# Patient Record
Sex: Female | Born: 1945
Health system: Southern US, Community
[De-identification: ages and names within clinical notes are randomized; demographics above are authoritative.]

## PROBLEM LIST (undated history)

## (undated) DIAGNOSIS — R51 Headache: Secondary | ICD-10-CM

## (undated) DIAGNOSIS — IMO0001 Reserved for inherently not codable concepts without codable children: Secondary | ICD-10-CM

## (undated) DIAGNOSIS — M1990 Unspecified osteoarthritis, unspecified site: Secondary | ICD-10-CM

## (undated) DIAGNOSIS — M1711 Unilateral primary osteoarthritis, right knee: Secondary | ICD-10-CM

## (undated) DIAGNOSIS — K219 Gastro-esophageal reflux disease without esophagitis: Secondary | ICD-10-CM

## (undated) DIAGNOSIS — E669 Obesity, unspecified: Secondary | ICD-10-CM

## (undated) DIAGNOSIS — R519 Headache, unspecified: Secondary | ICD-10-CM

## (undated) DIAGNOSIS — I10 Essential (primary) hypertension: Secondary | ICD-10-CM

## (undated) HISTORY — PX: OTHER SURGICAL HISTORY: SHX169

## (undated) HISTORY — PX: TUBAL LIGATION: SHX77

## (undated) HISTORY — PX: HERNIA REPAIR: SHX51

## (undated) HISTORY — PX: KNEE ARTHROSCOPY: SHX127

## (undated) HISTORY — DX: Obesity, unspecified: E66.9

## (undated) HISTORY — PX: CHOLECYSTECTOMY: SHX55

## (undated) HISTORY — PX: APPENDECTOMY: SHX54

---

## 1998-12-30 ENCOUNTER — Other Ambulatory Visit: Admission: RE | Admit: 1998-12-30 | Discharge: 1998-12-30 | Payer: Self-pay | Admitting: Obstetrics & Gynecology

## 2014-03-06 DIAGNOSIS — Z1211 Encounter for screening for malignant neoplasm of colon: Secondary | ICD-10-CM | POA: Diagnosis not present

## 2014-03-06 DIAGNOSIS — I10 Essential (primary) hypertension: Secondary | ICD-10-CM | POA: Diagnosis not present

## 2014-03-06 DIAGNOSIS — E780 Pure hypercholesterolemia: Secondary | ICD-10-CM | POA: Diagnosis not present

## 2014-03-08 DIAGNOSIS — Z1231 Encounter for screening mammogram for malignant neoplasm of breast: Secondary | ICD-10-CM | POA: Diagnosis not present

## 2014-04-11 DIAGNOSIS — M2391 Unspecified internal derangement of right knee: Secondary | ICD-10-CM | POA: Diagnosis not present

## 2014-04-22 DIAGNOSIS — Z1212 Encounter for screening for malignant neoplasm of rectum: Secondary | ICD-10-CM | POA: Diagnosis not present

## 2014-04-22 DIAGNOSIS — Z1211 Encounter for screening for malignant neoplasm of colon: Secondary | ICD-10-CM | POA: Diagnosis not present

## 2014-10-22 DIAGNOSIS — M1711 Unilateral primary osteoarthritis, right knee: Secondary | ICD-10-CM | POA: Diagnosis not present

## 2014-10-24 DIAGNOSIS — Z1389 Encounter for screening for other disorder: Secondary | ICD-10-CM | POA: Diagnosis not present

## 2014-10-24 DIAGNOSIS — Z23 Encounter for immunization: Secondary | ICD-10-CM | POA: Diagnosis not present

## 2014-10-24 DIAGNOSIS — Z01810 Encounter for preprocedural cardiovascular examination: Secondary | ICD-10-CM | POA: Diagnosis not present

## 2014-10-24 DIAGNOSIS — Z01818 Encounter for other preprocedural examination: Secondary | ICD-10-CM | POA: Diagnosis not present

## 2014-11-03 NOTE — Pre-Procedure Instructions (Signed)
Samantha Schwartz  11/03/2014     No Pharmacies Listed   Your procedure is scheduled on Tues, Oct 11 @ 11:30 AM  Report to [facility] Admitting at 9:30 AM  Call this number if you have problems the morning of surgery:  [phone]   Remember:  Do not eat food or drink liquids after midnight.  Take these medicines the morning of surgery with A SIP OF WATER: Norvasc            NO Goody's,BC's,Aleve,Aspirin,Ibuprofen,Fish Oil,or any Herbal Medications.    Do not wear jewelry, make-up or nail polish.  Do not wear lotions, powders, or perfumes.  You may wear deodorant.  Do not shave 48 hours prior to surgery.  Men may shave face and neck.  Do not bring valuables to the hospital.  [facility] is not responsible for any belongings or valuables.  Contacts, dentures or bridgework may not be worn into surgery.  Leave your suitcase in the car.  After surgery it may be brought to your room.  For patients admitted to the hospital, discharge time will be determined by your treatment team.  Patients discharged the day of surgery will not be allowed to drive home.    Special instructions:  [facility] - Preparing for Surgery  Before surgery, you can play an important role.  Because skin is not sterile, your skin needs to be as free of germs as possible.  You can reduce the number of germs on you skin by washing with CHG (chlorahexidine gluconate) soap before surgery.  CHG is an antiseptic cleaner which kills germs and bonds with the skin to continue killing germs even after washing.  Please DO NOT use if you have an allergy to CHG or antibacterial soaps.  If your skin becomes reddened/irritated stop using the CHG and inform your nurse when you arrive at Short Stay.  Do not shave (including legs and underarms) for at least 48 hours prior to the first CHG shower.  You may shave your face.  Please follow these instructions carefully:   1.  Shower with CHG Soap the night before  surgery and the                                morning of Surgery.  2.  If you choose to wash your hair, wash your hair first as usual with your       normal shampoo.  3.  After you shampoo, rinse your hair and body thoroughly to remove the                      Shampoo.  4.  Use CHG as you would any other liquid soap.  You can apply chg directly       to the skin and wash gently with scrungie or a clean washcloth.  5.  Apply the CHG Soap to your body ONLY FROM THE NECK DOWN.        Do not use on open wounds or open sores.  Avoid contact with your eyes,       ears, mouth and genitals (private parts).  Wash genitals (private parts)       with your normal soap.  6.  Wash thoroughly, paying special attention to the area where your surgery        will be performed.  7.  Thoroughly rinse your  body with warm water from the neck down.  8.  DO NOT shower/wash with your normal soap after using and rinsing off       the CHG Soap.  9.  Pat yourself dry with a clean towel.            10.  Wear clean pajamas.            11.  Place clean sheets on your bed the night of your first shower and do not        sleep with pets.  Day of Surgery  Do not apply any lotions/deoderants the morning of surgery.  Please wear clean clothes to the hospital/surgery center.    Please read over the following fact sheets that you were given. Pain Booklet, Coughing and Deep Breathing, MRSA Information and Surgical Site Infection Prevention

## 2014-11-05 ENCOUNTER — Encounter (HOSPITAL_COMMUNITY): Payer: Self-pay

## 2014-11-05 ENCOUNTER — Encounter (HOSPITAL_COMMUNITY)
Admission: RE | Admit: 2014-11-05 | Discharge: 2014-11-05 | Disposition: A | Discharge status: Home / Self Care | Payer: Commercial Managed Care - HMO | Source: Ambulatory Visit | Attending: Orthopedic Surgery | Admitting: Orthopedic Surgery

## 2014-11-05 DIAGNOSIS — Z01818 Encounter for other preprocedural examination: Secondary | ICD-10-CM | POA: Diagnosis not present

## 2014-11-05 DIAGNOSIS — M179 Osteoarthritis of knee, unspecified: Secondary | ICD-10-CM | POA: Diagnosis not present

## 2014-11-05 HISTORY — DX: Headache: R51

## 2014-11-05 HISTORY — DX: Gastro-esophageal reflux disease without esophagitis: K21.9

## 2014-11-05 HISTORY — DX: Essential (primary) hypertension: I10

## 2014-11-05 HISTORY — DX: Unspecified osteoarthritis, unspecified site: M19.90

## 2014-11-05 HISTORY — DX: Headache, unspecified: R51.9

## 2014-11-05 HISTORY — DX: Reserved for inherently not codable concepts without codable children: IMO0001

## 2014-11-05 LAB — CBC
HEMATOCRIT: 37.8 % (ref 36.0–46.0)
Hemoglobin: 12.4 g/dL (ref 12.0–15.0)
MCH: 28.8 pg (ref 26.0–34.0)
MCHC: 32.8 g/dL (ref 30.0–36.0)
MCV: 87.9 fL (ref 78.0–100.0)
PLATELETS: 372 10*3/uL (ref 150–400)
RBC: 4.3 MIL/uL (ref 3.87–5.11)
RDW: 12.3 % (ref 11.5–15.5)
WBC: 6.9 10*3/uL (ref 4.0–10.5)

## 2014-11-05 LAB — BASIC METABOLIC PANEL
Anion gap: 9 (ref 5–15)
BUN: 12 mg/dL (ref 6–20)
CHLORIDE: 101 mmol/L (ref 101–111)
CO2: 25 mmol/L (ref 22–32)
CREATININE: 0.88 mg/dL (ref 0.44–1.00)
Calcium: 9.5 mg/dL (ref 8.9–10.3)
GFR calc Af Amer: >60 mL/min (ref >60)
GFR calc non Af Amer: >60 mL/min (ref >60)
GLUCOSE: 107 mg/dL — AB (ref 65–99)
POTASSIUM: 4 mmol/L (ref 3.5–5.1)
Sodium: 135 mmol/L (ref 135–145)

## 2014-11-05 LAB — SURGICAL PCR SCREEN
MRSA, PCR: NEGATIVE
STAPHYLOCOCCUS AUREUS: NEGATIVE

## 2014-11-05 NOTE — Progress Notes (Signed)
PCP is Dr Bertram Millard at [facility] in [place] Denies ever seeing a cardiologist Denies ever having a stress test or echo States that she had a card cath many yeas ago.

## 2014-11-13 ENCOUNTER — Inpatient Hospital Stay (HOSPITAL_COMMUNITY): Payer: Commercial Managed Care - HMO | Admitting: Anesthesiology

## 2014-11-13 ENCOUNTER — Inpatient Hospital Stay (HOSPITAL_COMMUNITY)
Admission: RE | Admit: 2014-11-13 | Discharge: 2014-11-15 | DRG: 470 | Disposition: A | Discharge status: Home Health | Payer: Commercial Managed Care - HMO | Source: Ambulatory Visit | Attending: Orthopedic Surgery | Admitting: Orthopedic Surgery

## 2014-11-13 ENCOUNTER — Encounter (HOSPITAL_COMMUNITY)
Admission: RE | Disposition: A | Discharge status: Home Health | Payer: Self-pay | Source: Ambulatory Visit | Attending: Orthopedic Surgery

## 2014-11-13 ENCOUNTER — Inpatient Hospital Stay (HOSPITAL_COMMUNITY): Payer: Commercial Managed Care - HMO

## 2014-11-13 ENCOUNTER — Encounter (HOSPITAL_COMMUNITY): Payer: Self-pay | Admitting: *Deleted

## 2014-11-13 DIAGNOSIS — M179 Osteoarthritis of knee, unspecified: Secondary | ICD-10-CM | POA: Diagnosis present

## 2014-11-13 DIAGNOSIS — D62 Acute posthemorrhagic anemia: Secondary | ICD-10-CM | POA: Diagnosis not present

## 2014-11-13 DIAGNOSIS — M1711 Unilateral primary osteoarthritis, right knee: Secondary | ICD-10-CM | POA: Diagnosis not present

## 2014-11-13 DIAGNOSIS — Z471 Aftercare following joint replacement surgery: Secondary | ICD-10-CM | POA: Diagnosis not present

## 2014-11-13 DIAGNOSIS — Z96651 Presence of right artificial knee joint: Secondary | ICD-10-CM | POA: Diagnosis not present

## 2014-11-13 DIAGNOSIS — Z7982 Long term (current) use of aspirin: Secondary | ICD-10-CM

## 2014-11-13 DIAGNOSIS — I10 Essential (primary) hypertension: Secondary | ICD-10-CM | POA: Diagnosis not present

## 2014-11-13 DIAGNOSIS — Z888 Allergy status to other drugs, medicaments and biological substances status: Secondary | ICD-10-CM | POA: Diagnosis not present

## 2014-11-13 DIAGNOSIS — Z96659 Presence of unspecified artificial knee joint: Secondary | ICD-10-CM

## 2014-11-13 DIAGNOSIS — K219 Gastro-esophageal reflux disease without esophagitis: Secondary | ICD-10-CM | POA: Diagnosis not present

## 2014-11-13 DIAGNOSIS — Z79899 Other long term (current) drug therapy: Secondary | ICD-10-CM | POA: Diagnosis not present

## 2014-11-13 DIAGNOSIS — Z889 Allergy status to unspecified drugs, medicaments and biological substances status: Secondary | ICD-10-CM | POA: Diagnosis not present

## 2014-11-13 DIAGNOSIS — M1710 Unilateral primary osteoarthritis, unspecified knee: Secondary | ICD-10-CM | POA: Diagnosis present

## 2014-11-13 DIAGNOSIS — R0602 Shortness of breath: Secondary | ICD-10-CM | POA: Diagnosis not present

## 2014-11-13 DIAGNOSIS — M25561 Pain in right knee: Secondary | ICD-10-CM | POA: Diagnosis present

## 2014-11-13 HISTORY — DX: Unilateral primary osteoarthritis, right knee: M17.11

## 2014-11-13 HISTORY — PX: TOTAL KNEE ARTHROPLASTY: SHX125

## 2014-11-13 SURGERY — ARTHROPLASTY, KNEE, TOTAL
Anesthesia: Spinal | Site: Knee | Laterality: Right

## 2014-11-13 MED ORDER — SODIUM CHLORIDE 0.9 % IR SOLN
Status: DC | PRN
  Administered 2014-11-13: 3000 mL

## 2014-11-13 MED ORDER — RIVAROXABAN 10 MG PO TABS
10.0000 mg | ORAL_TABLET | Freq: Every day | ORAL | Status: DC
  Administered 2014-11-14 – 2014-11-15 (×2): 10 mg via ORAL
  Filled 2014-11-13 (×2): qty 1

## 2014-11-13 MED ORDER — HYDROMORPHONE HCL 1 MG/ML IJ SOLN
INTRAMUSCULAR | Status: AC
Start: 2014-11-13 — End: 2014-11-14
  Filled 2014-11-13: qty 1

## 2014-11-13 MED ORDER — AMLODIPINE BESYLATE 5 MG PO TABS
5.0000 mg | ORAL_TABLET | Freq: Every day | ORAL | Status: DC
  Administered 2014-11-14 – 2014-11-15 (×2): 5 mg via ORAL
  Filled 2014-11-13 (×2): qty 1

## 2014-11-13 MED ORDER — ACETAMINOPHEN 650 MG RE SUPP: 650.0000 mg | Freq: Four times a day (QID) | RECTAL | Status: DC | PRN

## 2014-11-13 MED ORDER — METOCLOPRAMIDE HCL 5 MG/ML IJ SOLN: 5.0000 mg | Freq: Three times a day (TID) | INTRAMUSCULAR | Status: DC | PRN

## 2014-11-13 MED ORDER — METHOCARBAMOL 1000 MG/10ML IJ SOLN
500.0000 mg | Freq: Four times a day (QID) | INTRAVENOUS | Status: DC | PRN
  Filled 2014-11-13: qty 5

## 2014-11-13 MED ORDER — DEXTROSE 5 % IV SOLN
10.0000 mg | INTRAVENOUS | Status: DC | PRN
  Administered 2014-11-13: 40 ug/min via INTRAVENOUS

## 2014-11-13 MED ORDER — ROCURONIUM BROMIDE 50 MG/5ML IV SOLN
INTRAVENOUS | Status: AC
Start: 2014-11-13 — End: 2014-11-13
  Filled 2014-11-13: qty 1

## 2014-11-13 MED ORDER — BACLOFEN 10 MG PO TABS: 10.0000 mg | ORAL_TABLET | Freq: Three times a day (TID) | ORAL | Status: DC

## 2014-11-13 MED ORDER — OXYCODONE-ACETAMINOPHEN 5-325 MG PO TABS: 1.0000 | ORAL_TABLET | Freq: Four times a day (QID) | ORAL | Status: DC | PRN

## 2014-11-13 MED ORDER — ACETAMINOPHEN 325 MG PO TABS: 650.0000 mg | ORAL_TABLET | Freq: Four times a day (QID) | ORAL | Status: DC | PRN

## 2014-11-13 MED ORDER — PHENYLEPHRINE 40 MCG/ML (10ML) SYRINGE FOR IV PUSH (FOR BLOOD PRESSURE SUPPORT)
PREFILLED_SYRINGE | INTRAVENOUS | Status: AC
  Filled 2014-11-13: qty 20

## 2014-11-13 MED ORDER — CEFAZOLIN SODIUM-DEXTROSE 2-3 GM-% IV SOLR
INTRAVENOUS | Status: AC
  Filled 2014-11-13: qty 50

## 2014-11-13 MED ORDER — MENTHOL 3 MG MT LOZG: 1.0000 | LOZENGE | OROMUCOSAL | Status: DC | PRN

## 2014-11-13 MED ORDER — DULOXETINE HCL 30 MG PO CPEP
30.0000 mg | ORAL_CAPSULE | Freq: Every day | ORAL | Status: DC
  Administered 2014-11-13 – 2014-11-15 (×3): 30 mg via ORAL
  Filled 2014-11-13 (×3): qty 1

## 2014-11-13 MED ORDER — POTASSIUM 99 MG PO TABS: 2.0000 | ORAL_TABLET | Freq: Every day | ORAL | Status: DC

## 2014-11-13 MED ORDER — BISACODYL 10 MG RE SUPP: 10.0000 mg | Freq: Every day | RECTAL | Status: DC | PRN

## 2014-11-13 MED ORDER — ONDANSETRON HCL 4 MG PO TABS
4.0000 mg | ORAL_TABLET | Freq: Four times a day (QID) | ORAL | Status: DC | PRN
  Administered 2014-11-14 – 2014-11-15 (×4): 4 mg via ORAL
  Filled 2014-11-13 (×4): qty 1

## 2014-11-13 MED ORDER — BUPIVACAINE HCL (PF) 0.25 % IJ SOLN
INTRAMUSCULAR | Status: AC
  Filled 2014-11-13: qty 30

## 2014-11-13 MED ORDER — MIDAZOLAM HCL 2 MG/2ML IJ SOLN
INTRAMUSCULAR | Status: AC
  Filled 2014-11-13: qty 2

## 2014-11-13 MED ORDER — KETOROLAC TROMETHAMINE 15 MG/ML IJ SOLN
7.5000 mg | Freq: Four times a day (QID) | INTRAMUSCULAR | Status: AC
  Administered 2014-11-13 – 2014-11-14 (×4): 7.5 mg via INTRAVENOUS
  Filled 2014-11-13 (×4): qty 1

## 2014-11-13 MED ORDER — DEXAMETHASONE SODIUM PHOSPHATE 10 MG/ML IJ SOLN
10.0000 mg | Freq: Once | INTRAMUSCULAR | Status: AC
  Administered 2014-11-14: 10 mg via INTRAVENOUS
  Filled 2014-11-13: qty 1

## 2014-11-13 MED ORDER — FENTANYL CITRATE (PF) 100 MCG/2ML IJ SOLN
INTRAMUSCULAR | Status: AC
  Filled 2014-11-13: qty 2

## 2014-11-13 MED ORDER — SENNA-DOCUSATE SODIUM 8.6-50 MG PO TABS: 2.0000 | ORAL_TABLET | Freq: Every day | ORAL | Status: DC

## 2014-11-13 MED ORDER — BUPIVACAINE HCL (PF) 0.25 % IJ SOLN
INTRAMUSCULAR | Status: DC | PRN
  Administered 2014-11-13: 20 mL via INTRA_ARTICULAR

## 2014-11-13 MED ORDER — ADULT MULTIVITAMIN W/MINERALS CH
1.0000 | ORAL_TABLET | Freq: Every day | ORAL | Status: DC
Start: 2014-11-13 — End: 2014-11-15
  Administered 2014-11-13 – 2014-11-15 (×3): 1 via ORAL
  Filled 2014-11-13 (×3): qty 1

## 2014-11-13 MED ORDER — PHENYLEPHRINE HCL 10 MG/ML IJ SOLN
INTRAMUSCULAR | Status: DC | PRN
  Administered 2014-11-13: 120 ug via INTRAVENOUS
  Administered 2014-11-13: 80 ug via INTRAVENOUS
  Administered 2014-11-13: 40 ug via INTRAVENOUS
  Administered 2014-11-13 (×3): 80 ug via INTRAVENOUS

## 2014-11-13 MED ORDER — POTASSIUM CHLORIDE IN NACL 20-0.45 MEQ/L-% IV SOLN
INTRAVENOUS | Status: DC
  Administered 2014-11-13: 22:00:00 via INTRAVENOUS
  Filled 2014-11-13 (×5): qty 1000

## 2014-11-13 MED ORDER — VALSARTAN-HYDROCHLOROTHIAZIDE 160-12.5 MG PO TABS: 1.0000 | ORAL_TABLET | Freq: Every day | ORAL | Status: DC

## 2014-11-13 MED ORDER — METHOCARBAMOL 500 MG PO TABS
ORAL_TABLET | ORAL | Status: AC
  Filled 2014-11-13: qty 1

## 2014-11-13 MED ORDER — POLYETHYLENE GLYCOL 3350 17 G PO PACK
17.0000 g | PACK | Freq: Every day | ORAL | Status: DC | PRN
  Filled 2014-11-13: qty 1

## 2014-11-13 MED ORDER — FENTANYL CITRATE (PF) 250 MCG/5ML IJ SOLN
INTRAMUSCULAR | Status: AC
  Filled 2014-11-13: qty 5

## 2014-11-13 MED ORDER — HYDROMORPHONE HCL 1 MG/ML IJ SOLN
0.2500 mg | INTRAMUSCULAR | Status: DC | PRN
  Administered 2014-11-13 (×4): 0.5 mg via INTRAVENOUS

## 2014-11-13 MED ORDER — PANTOPRAZOLE SODIUM 40 MG PO TBEC
80.0000 mg | DELAYED_RELEASE_TABLET | Freq: Every day | ORAL | Status: DC
  Administered 2014-11-13 – 2014-11-14 (×2): 80 mg via ORAL
  Filled 2014-11-13 (×2): qty 2

## 2014-11-13 MED ORDER — HYDROMORPHONE HCL 1 MG/ML IJ SOLN: 0.5000 mg | INTRAMUSCULAR | Status: DC | PRN

## 2014-11-13 MED ORDER — RIVAROXABAN 10 MG PO TABS: 10.0000 mg | ORAL_TABLET | Freq: Every day | ORAL | Status: DC

## 2014-11-13 MED ORDER — ALUM & MAG HYDROXIDE-SIMETH 200-200-20 MG/5ML PO SUSP: 30.0000 mL | ORAL | Status: DC | PRN

## 2014-11-13 MED ORDER — ONDANSETRON HCL 4 MG PO TABS: 4.0000 mg | ORAL_TABLET | Freq: Three times a day (TID) | ORAL | Status: DC | PRN

## 2014-11-13 MED ORDER — PHENYLEPHRINE HCL 10 MG/ML IJ SOLN
0.0000 ug/min | INTRAVENOUS | Status: DC
  Filled 2014-11-13: qty 1

## 2014-11-13 MED ORDER — BUPIVACAINE IN DEXTROSE 0.75-8.25 % IT SOLN
INTRATHECAL | Status: DC | PRN
  Administered 2014-11-13: 1.8 mL via INTRATHECAL

## 2014-11-13 MED ORDER — MIDAZOLAM HCL 2 MG/2ML IJ SOLN
INTRAMUSCULAR | Status: AC
  Filled 2014-11-13: qty 4

## 2014-11-13 MED ORDER — HYDROCHLOROTHIAZIDE 12.5 MG PO CAPS
12.5000 mg | ORAL_CAPSULE | Freq: Every day | ORAL | Status: DC
  Administered 2014-11-14 – 2014-11-15 (×2): 12.5 mg via ORAL
  Filled 2014-11-13 (×2): qty 1

## 2014-11-13 MED ORDER — PROPOFOL 10 MG/ML IV BOLUS
INTRAVENOUS | Status: AC
  Filled 2014-11-13: qty 20

## 2014-11-13 MED ORDER — METOCLOPRAMIDE HCL 5 MG PO TABS
5.0000 mg | ORAL_TABLET | Freq: Three times a day (TID) | ORAL | Status: DC | PRN
  Administered 2014-11-14: 5 mg via ORAL
  Filled 2014-11-13: qty 1

## 2014-11-13 MED ORDER — FENTANYL CITRATE (PF) 100 MCG/2ML IJ SOLN
INTRAMUSCULAR | Status: DC | PRN
  Administered 2014-11-13 (×2): 25 ug via INTRAVENOUS

## 2014-11-13 MED ORDER — OXYCODONE HCL 5 MG PO TABS
ORAL_TABLET | ORAL | Status: AC
  Filled 2014-11-13: qty 1

## 2014-11-13 MED ORDER — 0.9 % SODIUM CHLORIDE (POUR BTL) OPTIME
TOPICAL | Status: DC | PRN
  Administered 2014-11-13: 1000 mL

## 2014-11-13 MED ORDER — BUPIVACAINE LIPOSOME 1.3 % IJ SUSP
INTRAMUSCULAR | Status: DC | PRN
  Administered 2014-11-13: 20 mL

## 2014-11-13 MED ORDER — CEFAZOLIN SODIUM-DEXTROSE 2-3 GM-% IV SOLR
2.0000 g | INTRAVENOUS | Status: AC
  Administered 2014-11-13: 2 g via INTRAVENOUS

## 2014-11-13 MED ORDER — PHENOL 1.4 % MT LIQD: 1.0000 | OROMUCOSAL | Status: DC | PRN

## 2014-11-13 MED ORDER — CEFAZOLIN SODIUM-DEXTROSE 2-3 GM-% IV SOLR
2.0000 g | Freq: Four times a day (QID) | INTRAVENOUS | Status: AC
Start: 2014-11-13 — End: 2014-11-14
  Administered 2014-11-13: 2 g via INTRAVENOUS
  Filled 2014-11-13 (×3): qty 50

## 2014-11-13 MED ORDER — PROPOFOL 500 MG/50ML IV EMUL
INTRAVENOUS | Status: DC | PRN
  Administered 2014-11-13: 100 ug/kg/min via INTRAVENOUS

## 2014-11-13 MED ORDER — CALCIUM CARB-CHOLECALCIFEROL 600-800 MG-UNIT PO TABS: 1.0000 | ORAL_TABLET | Freq: Two times a day (BID) | ORAL | Status: DC

## 2014-11-13 MED ORDER — OXYCODONE HCL 5 MG PO TABS
5.0000 mg | ORAL_TABLET | ORAL | Status: DC | PRN
Start: 2014-11-13 — End: 2014-11-15
  Administered 2014-11-13: 5 mg via ORAL
  Administered 2014-11-13 – 2014-11-14 (×3): 10 mg via ORAL
  Administered 2014-11-14 (×2): 5 mg via ORAL
  Administered 2014-11-14 (×2): 10 mg via ORAL
  Administered 2014-11-15 (×3): 5 mg via ORAL
  Filled 2014-11-13 (×3): qty 2
  Filled 2014-11-13 (×2): qty 1
  Filled 2014-11-13: qty 2
  Filled 2014-11-13 (×2): qty 1
  Filled 2014-11-13 (×2): qty 2

## 2014-11-13 MED ORDER — SENNA 8.6 MG PO TABS
1.0000 | ORAL_TABLET | Freq: Two times a day (BID) | ORAL | Status: DC
  Administered 2014-11-13 – 2014-11-15 (×4): 8.6 mg via ORAL
  Filled 2014-11-13 (×4): qty 1

## 2014-11-13 MED ORDER — MIDAZOLAM HCL 5 MG/5ML IJ SOLN
INTRAMUSCULAR | Status: DC | PRN
  Administered 2014-11-13: 2 mg via INTRAVENOUS

## 2014-11-13 MED ORDER — MAGNESIUM CITRATE PO SOLN: 1.0000 | Freq: Once | ORAL | Status: DC | PRN

## 2014-11-13 MED ORDER — METHOCARBAMOL 500 MG PO TABS
500.0000 mg | ORAL_TABLET | Freq: Four times a day (QID) | ORAL | Status: DC | PRN
  Administered 2014-11-13 – 2014-11-15 (×3): 500 mg via ORAL
  Filled 2014-11-13 (×3): qty 1

## 2014-11-13 MED ORDER — IRBESARTAN 150 MG PO TABS
150.0000 mg | ORAL_TABLET | Freq: Every day | ORAL | Status: DC
  Administered 2014-11-14 – 2014-11-15 (×2): 150 mg via ORAL
  Filled 2014-11-13 (×2): qty 1

## 2014-11-13 MED ORDER — LACTATED RINGERS IV SOLN
INTRAVENOUS | Status: DC | PRN
  Administered 2014-11-13 (×3): via INTRAVENOUS

## 2014-11-13 MED ORDER — ONDANSETRON HCL 4 MG/2ML IJ SOLN
4.0000 mg | Freq: Four times a day (QID) | INTRAMUSCULAR | Status: DC | PRN
  Administered 2014-11-13: 4 mg via INTRAVENOUS
  Filled 2014-11-13: qty 2

## 2014-11-13 MED ORDER — HYDROMORPHONE HCL 1 MG/ML IJ SOLN
INTRAMUSCULAR | Status: AC
  Filled 2014-11-13: qty 1

## 2014-11-13 MED ORDER — MULTI-VITAMIN/MINERALS PO TABS: 1.0000 | ORAL_TABLET | Freq: Every day | ORAL | Status: DC

## 2014-11-13 MED ORDER — BUPIVACAINE LIPOSOME 1.3 % IJ SUSP
20.0000 mL | Freq: Once | INTRAMUSCULAR | Status: DC
  Filled 2014-11-13: qty 20

## 2014-11-13 MED ORDER — DOCUSATE SODIUM 100 MG PO CAPS
100.0000 mg | ORAL_CAPSULE | Freq: Two times a day (BID) | ORAL | Status: DC
  Administered 2014-11-13 – 2014-11-15 (×4): 100 mg via ORAL
  Filled 2014-11-13 (×4): qty 1

## 2014-11-13 MED ORDER — LIDOCAINE HCL (CARDIAC) 20 MG/ML IV SOLN
INTRAVENOUS | Status: AC
Start: 2014-11-13 — End: 2014-11-13
  Filled 2014-11-13: qty 5

## 2014-11-13 MED ORDER — DIPHENHYDRAMINE HCL 12.5 MG/5ML PO ELIX: 12.5000 mg | ORAL_SOLUTION | ORAL | Status: DC | PRN

## 2014-11-13 MED ORDER — KETOROLAC TROMETHAMINE 15 MG/ML IJ SOLN
INTRAMUSCULAR | Status: AC
  Filled 2014-11-13: qty 1

## 2014-11-13 MED ORDER — CRANBERRY-VITAMIN C-VITAMIN E 4200-20-3 MG-MG-UNIT PO CAPS: 2.0000 | ORAL_CAPSULE | Freq: Every day | ORAL | Status: DC

## 2014-11-13 MED ORDER — LACTATED RINGERS IV SOLN
INTRAVENOUS | Status: DC
  Administered 2014-11-13: 10:00:00 via INTRAVENOUS

## 2014-11-13 MED ORDER — CALCIUM CARBONATE-VITAMIN D 500-200 MG-UNIT PO TABS
1.0000 | ORAL_TABLET | Freq: Two times a day (BID) | ORAL | Status: DC
  Administered 2014-11-13 – 2014-11-15 (×4): 1 via ORAL
  Filled 2014-11-13 (×4): qty 1

## 2014-11-13 MED ORDER — PROPOFOL 10 MG/ML IV BOLUS
INTRAVENOUS | Status: DC | PRN
  Administered 2014-11-13: 20 mg via INTRAVENOUS
  Administered 2014-11-13: 40 mg via INTRAVENOUS
  Administered 2014-11-13: 30 mg via INTRAVENOUS
  Administered 2014-11-13: 20 mg via INTRAVENOUS
  Administered 2014-11-13: 10 mg via INTRAVENOUS
  Administered 2014-11-13: 30 mg via INTRAVENOUS
  Administered 2014-11-13: 20 mg via INTRAVENOUS

## 2014-11-13 SURGICAL SUPPLY — 64 items
BANDAGE ELASTIC 6 VELCRO ST LF (GAUZE/BANDAGES/DRESSINGS) ×6 IMPLANT
BANDAGE ESMARK 6X9 LF (GAUZE/BANDAGES/DRESSINGS) ×1 IMPLANT
BENZOIN TINCTURE PRP APPL 2/3 (GAUZE/BANDAGES/DRESSINGS) ×3 IMPLANT
BLADE SAG 18X100X1.27 (BLADE) ×3 IMPLANT
BLADE SAW RECIP 87.9 MT (BLADE) ×3 IMPLANT
BLADE SAW SGTL 13X75X1.27 (BLADE) ×3 IMPLANT
BNDG ESMARK 6X9 LF (GAUZE/BANDAGES/DRESSINGS) ×3
BOOTCOVER CLEANROOM LRG (PROTECTIVE WEAR) ×6 IMPLANT
BOWL SMART MIX CTS (DISPOSABLE) ×3 IMPLANT
CAP KNEE TOTAL 3 SIGMA ×3 IMPLANT
CEMENT HV SMART SET (Cement) ×6 IMPLANT
CLOSURE STERI-STRIP 1/2X4 (GAUZE/BANDAGES/DRESSINGS) ×1
CLSR STERI-STRIP ANTIMIC 1/2X4 (GAUZE/BANDAGES/DRESSINGS) ×2 IMPLANT
COVER SURGICAL LIGHT HANDLE (MISCELLANEOUS) ×3 IMPLANT
CUFF TOURNIQUET SINGLE 34IN LL (TOURNIQUET CUFF) ×3 IMPLANT
DRAPE EXTREMITY T 121X128X90 (DRAPE) ×3 IMPLANT
DRAPE U-SHAPE 47X51 STRL (DRAPES) ×3 IMPLANT
DRSG PAD ABDOMINAL 8X10 ST (GAUZE/BANDAGES/DRESSINGS) ×3 IMPLANT
DURAPREP 26ML APPLICATOR (WOUND CARE) ×3 IMPLANT
ELECT CAUTERY BLADE 6.4 (BLADE) ×3 IMPLANT
ELECT REM PT RETURN 9FT ADLT (ELECTROSURGICAL) ×3
ELECTRODE REM PT RTRN 9FT ADLT (ELECTROSURGICAL) ×1 IMPLANT
FACESHIELD STD STERILE (MASK) ×3 IMPLANT
GAUZE SPONGE 4X4 12PLY STRL (GAUZE/BANDAGES/DRESSINGS) ×3 IMPLANT
GLOVE BIOGEL PI IND STRL 8 (GLOVE) ×1 IMPLANT
GLOVE BIOGEL PI INDICATOR 8 (GLOVE) ×2
GLOVE BIOGEL PI ORTHO PRO SZ8 (GLOVE) ×2
GLOVE ORTHO TXT STRL SZ7.5 (GLOVE) ×3 IMPLANT
GLOVE PI ORTHO PRO STRL SZ8 (GLOVE) ×1 IMPLANT
GLOVE SURG ORTHO 8.0 STRL STRW (GLOVE) ×3 IMPLANT
GOWN STRL REUS W/ TWL XL LVL3 (GOWN DISPOSABLE) ×1 IMPLANT
GOWN STRL REUS W/TWL 2XL LVL3 (GOWN DISPOSABLE) ×3 IMPLANT
GOWN STRL REUS W/TWL XL LVL3 (GOWN DISPOSABLE) ×2
HANDPIECE INTERPULSE COAX TIP (DISPOSABLE) ×2
HOOD PEEL AWAY FACE SHEILD DIS (HOOD) ×6 IMPLANT
IMMOBILIZER KNEE 22 (SOFTGOODS) ×3 IMPLANT
KIT BASIN OR (CUSTOM PROCEDURE TRAY) ×3 IMPLANT
KIT ROOM TURNOVER OR (KITS) ×3 IMPLANT
MANIFOLD NEPTUNE II (INSTRUMENTS) ×3 IMPLANT
NEEDLE 18GX1X1/2 (RX/OR ONLY) (NEEDLE) ×3 IMPLANT
NS IRRIG 1000ML POUR BTL (IV SOLUTION) ×3 IMPLANT
PACK TOTAL JOINT (CUSTOM PROCEDURE TRAY) ×3 IMPLANT
PACK UNIVERSAL I (CUSTOM PROCEDURE TRAY) ×3 IMPLANT
PAD ABD 8X10 STRL (GAUZE/BANDAGES/DRESSINGS) ×3 IMPLANT
PAD ARMBOARD 7.5X6 YLW CONV (MISCELLANEOUS) ×6 IMPLANT
PAD CAST 4YDX4 CTTN HI CHSV (CAST SUPPLIES) ×1 IMPLANT
PADDING CAST COTTON 4X4 STRL (CAST SUPPLIES) ×2
PADDING CAST COTTON 6X4 STRL (CAST SUPPLIES) ×3 IMPLANT
SET HNDPC FAN SPRY TIP SCT (DISPOSABLE) ×1 IMPLANT
SUCTION FRAZIER TIP 10 FR DISP (SUCTIONS) ×3 IMPLANT
SUT MNCRL AB 4-0 PS2 18 (SUTURE) IMPLANT
SUT VIC AB 0 CT1 27 (SUTURE) ×2
SUT VIC AB 0 CT1 27XBRD ANBCTR (SUTURE) ×1 IMPLANT
SUT VIC AB 1 CT1 27 (SUTURE) ×2
SUT VIC AB 1 CT1 27XBRD ANBCTR (SUTURE) ×1 IMPLANT
SUT VIC AB 2-0 CT1 27 (SUTURE) ×2
SUT VIC AB 2-0 CT1 TAPERPNT 27 (SUTURE) ×1 IMPLANT
SUT VIC AB 3-0 SH 8-18 (SUTURE) ×6 IMPLANT
SYR 30ML LL (SYRINGE) IMPLANT
SYR 50ML LL SCALE MARK (SYRINGE) ×3 IMPLANT
TOWEL OR 17X24 6PK STRL BLUE (TOWEL DISPOSABLE) ×3 IMPLANT
TOWEL OR 17X26 10 PK STRL BLUE (TOWEL DISPOSABLE) ×3 IMPLANT
TRAY CATH 16FR W/PLASTIC CATH (SET/KITS/TRAYS/PACK) ×3 IMPLANT
WATER STERILE IRR 1000ML POUR (IV SOLUTION) IMPLANT

## 2014-11-13 NOTE — Progress Notes (Signed)
Utilization review completed.  

## 2014-11-13 NOTE — Evaluation (Signed)
Physical Therapy Evaluation Patient Details Name: Samantha Schwartz MRN: [id number] DOB: 11-07-1945 Today's Date: 11/13/2014   History of Present Illness  69 y.o. female admitted to [facility] on 11/13/14 for elective R TKA.  Pt with significant PMHx of HTN, SOB, HA, and R arm surgery.  Clinical Impression  Pt is POD #0 and is moving well despite nausea.  She was able to walk a few feet in the room with RW and get OOB to the chair.  VSS throughout session.   PT to follow acutely for deficits listed below.   I anticipate she will progress well enough to go home with HHPT and family's support at discharge.     Follow Up Recommendations Home health PT;Supervision for mobility/OOB    Equipment Recommendations  None recommended by PT    Recommendations for Other Services   NA    Precautions / Restrictions Precautions Precautions: Knee Precaution Comments: knee precautions and WB status reviewed Restrictions Weight Bearing Restrictions: Yes RLE Weight Bearing: Weight bearing as tolerated      Mobility  Bed Mobility Overal bed mobility: Needs Assistance Bed Mobility: Supine to Sit     Supine to sit: Min assist     General bed mobility comments: Min assist of right leg with verbal cues for 1/2 bridge technique and sequence.  Transfers Overall transfer level: Needs assistance Equipment used: Rolling walker (2 wheeled) Transfers: Sit to/from Stand Sit to Stand: Min assist         General transfer comment: Min assist to support trunk during transitions.  Verbal cues for safe hand placement.   Ambulation/Gait Ambulation/Gait assistance: Min assist Ambulation Distance (Feet): 5 Feet Assistive device: Rolling walker (2 wheeled) Gait Pattern/deviations: Step-to pattern;Antalgic     General Gait Details:  Verbal cues for LE sequencing, safe use of RW, and min assist to support trunk while WB through right foot.          Balance Overall balance assessment: Needs  assistance Sitting-balance support: Feet supported;No upper extremity supported Sitting balance-Leahy Scale: Good     Standing balance support: Bilateral upper extremity supported Standing balance-Leahy Scale: Poor                               Pertinent Vitals/Pain Pain Assessment: 0-10 Pain Score: 5  Pain Location: right knee Pain Descriptors / Indicators: Aching;Burning;Constant Pain Intervention(s): Limited activity within patient's tolerance;Monitored during session;Repositioned    Home Living Family/patient expects to be discharged to:: Private residence Living Arrangements: Spouse/significant other Available Help at Discharge: Family;Available 24 hours/day (spouse and daughter) Type of Home: House Home Access: Stairs to enter Entrance Stairs-Rails: None Entrance Stairs-Number of Steps: 1 Home Layout: One level Home Equipment: Walker - 2 wheels;Cane - single point;Bedside commode      Prior Function Level of Independence: Independent with assistive device(s)         Comments: ended up having to walk with RW for the last two weeks prior to surgery.         Extremity/Trunk Assessment   Upper Extremity Assessment: Defer to OT evaluation           Lower Extremity Assessment: RLE deficits/detail RLE Deficits / Details: right leg with normal post op pain and weakness.  Pt with 3/5 ankle, 2/5 knee, 2+/5 hip flexion.     Cervical / Trunk Assessment: Normal  Communication   Communication: No difficulties  Cognition Arousal/Alertness: Awake/alert Behavior During Therapy: WFL for  tasks assessed/performed Overall Cognitive Status: Within Functional Limits for tasks assessed                      General Comments General comments (skin integrity, edema, etc.): Pt immediately nauseated after sitting down in recliner chair. Vomited a small amount.  RN made aware and nausea meds given Vitals stable.     Exercises Total Joint Exercises Ankle  Circles/Pumps: AROM;Both;20 reps;Supine      Assessment/Plan    PT Assessment Patient needs continued PT services  PT Diagnosis Difficulty walking;Abnormality of gait;Generalized weakness;Acute pain   PT Problem List Decreased strength;Decreased range of motion;Decreased activity tolerance;Decreased balance;Decreased mobility;Decreased knowledge of use of DME;Pain  PT Treatment Interventions DME instruction;Gait training;Stair training;Functional mobility training;Therapeutic activities;Therapeutic exercise;Balance training;Neuromuscular re-education;Patient/family education;Modalities;Manual techniques   PT Goals (Current goals can be found in the Care Plan section) Acute Rehab PT Goals Patient Stated Goal: to get her knee better in time for Thanksgiving (because she likes to cook) PT Goal Formulation: With patient Time For Goal Achievement: 11/20/14 Potential to Achieve Goals: Good    Frequency 7X/week           End of Session Equipment Utilized During Treatment: Right knee immobilizer Activity Tolerance: Other (comment) (limited by nausea) Patient left: in chair;with call bell/phone within reach;with family/visitor present;with nursing/sitter in room Nurse Communication: Mobility status         Time: 1800-1830 PT Time Calculation (min) (ACUTE ONLY): 30 min   Charges:   PT Evaluation $Initial PT Evaluation Tier I: 1 Procedure PT Treatments $Therapeutic Activity: 8-22 mins        Justan Gaede B. Marlen Mollica, PT, DPT [phone]   11/13/2014, 6:39 PM

## 2014-11-13 NOTE — Plan of Care (Signed)
Problem: Consults Goal: Diagnosis- Total Joint Replacement Outcome: Completed/Met Date Met:  11/13/14 Primary Total Knee right

## 2014-11-13 NOTE — Op Note (Signed)
DATE OF SURGERY:  11/13/2014 TIME: 12:45 PM  PATIENT NAME:  Samantha Schwartz   AGE: 69 y.o.    PRE-OPERATIVE DIAGNOSIS:  Right knee severe valgus osteoarthritis   POST-OPERATIVE DIAGNOSIS:  Same  PROCEDURE:  Procedure(s): TOTAL KNEE ARTHROPLASTY   SURGEON:  Johnny Bridge, MD   ASSISTANT:  Joya Gaskins, OPA-C, present and scrubbed throughout the case, critical for assistance with exposure, retraction, instrumentation, and closure.   OPERATIVE IMPLANTS: Depuy PFC Sigma, Posterior Stabilized.  Femur size 3, Tibia size 3, Patella size 35 3-peg oval button, with a 10 mm polyethylene insert.  Anesthesia: Spinal with Exparel injected at the completion of the case.   PREOPERATIVE INDICATIONS:  Samantha Schwartz is a 69 y.o. year old female with end stage bone on bone degenerative arthritis of the knee who failed conservative treatment, including injections, antiinflammatories, activity modification, and assistive devices, and had significant impairment of their activities of daily living, and elected for Total Knee Arthroplasty.   The risks, benefits, and alternatives were discussed at length including but not limited to the risks of infection, bleeding, nerve injury, stiffness, blood clots, the need for revision surgery, cardiopulmonary complications, among others, and they were willing to proceed.  OPERATIVE FINDINGS AND UNIQUE ASPECTS OF THE CASE:  She had severe valgus deformity with a 5 flexion contracture. She was about 35 or even 45 valgus. She had a severely hypoplastic lateral femoral condyle. I performed my distal femoral resection at 10 mm, and just barely scraped the lateral condyle. My resections on both the tibia as well as the femur were all extremely minimal on the lateral side. I had to externally rotate the distal femoral sizing jig manually in order to have appropriate position. The patella had severe lateral maltracking preoperatively, I was able to centralize it  postoperatively, but it required repair of the retinaculum in order to track centrally. The bone quality was overall fairly poor. The patella was fairly small as well as extremely worn, and at its highest it measured 19 mm, my patellar resection effectively leveled off with the deepest grooved portion of the patella which was 14 mm.  OPERATIVE DESCRIPTION:  The patient was brought to the operative room and placed in a supine position.  Spinal anesthesia was administered.  IV antibiotics were given.  The lower extremity was prepped and draped in the usual sterile fashion.  Time out was performed.  The leg was elevated and exsanguinated and the tourniquet was inflated.  Anterior quadriceps tendon splitting approach was performed.  The patella was everted and osteophytes were removed.  The anterior horn of the medial and lateral meniscus was removed.   The distal femur was opened with the drill and the intramedullary distal femoral cutting jig was utilized, set at 5 degrees resecting 10 mm off the distal femur.  Care was taken to protect the collateral ligaments.  Then the extramedullary tibial cutting jig was utilized making the appropriate cut using the anterior tibial crest as a reference building in appropriate posterior slope.  Care was taken during the cut to protect the medial and collateral ligaments.  The proximal tibia was removed along with the posterior horns of the menisci.  The PCL was sacrificed.    The extensor gap was measured and was approximately 53mm.    The distal femoral sizing jig was applied, taking care to avoid notching.  Then the 4-in-1 cutting jig was applied and the anterior and posterior femur was cut, along with the chamfer cuts.  All posterior osteophytes were removed.  The flexion gap was then measured and was symmetric with the extension gap.  I completed the distal femoral preparation using the appropriate jig to prepare the box.  The patella was then measured, and  cut with the saw.  The thickness before the cut was 19 and after the cut was 14.  The proximal tibia sized and prepared accordingly with the reamer and the punch, and then all components were trialed with the 66mm poly insert.  The knee was found to have excellent balance and full motion.    The above named components were then cemented into place and all excess cement was removed.  The real polyethylene implant was placed.  After the cement had cured I released the tourniquet and confirmed excellent hemostasis with no major posterior vessel injury.    The knee was easily taken through a range of motion and the patella tracked well and the knee irrigated copiously and the parapatellar and subcutaneous tissue closed with vicryl, and monocryl with steri strips for the skin.  The wounds were injected with marcaine, and dressed with sterile gauze and the patient was awakened and returned to the PACU in stable and satisfactory condition.  There were no complications.  Total tourniquet time was ~75 minutes.

## 2014-11-13 NOTE — Anesthesia Procedure Notes (Signed)
Spinal Patient location during procedure: OR Start time: 11/13/2014 10:53 AM End time: 11/13/2014 11:00 AM Staffing Anesthesiologist: Roderic Palau Performed by: anesthesiologist  Preanesthetic Checklist Completed: patient identified, surgical consent, pre-op evaluation, timeout performed, IV checked, risks and benefits discussed and monitors and equipment checked Spinal Block Patient position: sitting Prep: DuraPrep Patient monitoring: cardiac monitor, continuous pulse ox and blood pressure Approach: midline Location: L3-4 Injection technique: single-shot Needle Needle type: Pencan  Needle gauge: 24 G Needle length: 9 cm Assessment Sensory level: T8 Additional Notes Functioning IV was confirmed and monitors were applied. Sterile prep and drape, including hand hygiene and sterile gloves were used. The patient was positioned and the spine was prepped. The skin was anesthetized with lidocaine.  Free flow of clear CSF was obtained prior to injecting local anesthetic into the CSF.  The spinal needle aspirated freely following injection.  The needle was carefully withdrawn.  The patient tolerated the procedure well.

## 2014-11-13 NOTE — Progress Notes (Signed)
Report given to [facility] sahver rn as caregiver

## 2014-11-13 NOTE — Anesthesia Postprocedure Evaluation (Signed)
  Anesthesia Post-op Note  Patient: Samantha Schwartz  Procedure(s) Performed: Procedure(s): TOTAL KNEE ARTHROPLASTY (Right)  Patient Location: PACU  Anesthesia Type: Spinal/MAC  Level of Consciousness: awake and alert   Airway and Oxygen Therapy: Patient Spontanous Breathing  Post-op Pain: Controlled  Post-op Assessment: Post-op Vital signs reviewed, Patient's Cardiovascular Status Stable and Respiratory Function Stable. Block receeding.  Post-op Vital Signs: Reviewed  Filed Vitals:   11/13/14 1400  BP:   Pulse: 71  Temp:   Resp: 17    Complications: No apparent anesthesia complications

## 2014-11-13 NOTE — Anesthesia Preprocedure Evaluation (Addendum)
Anesthesia Evaluation  Patient identified by MRN, date of birth, ID band Patient awake    Reviewed: Allergy & Precautions, H&P , NPO status , Patient's Chart, lab work & pertinent test results  Airway Mallampati: III  TM Distance: >3 FB Neck ROM: Full    Dental no notable dental hx. (+) Edentulous Upper, Dental Advisory Given   Pulmonary neg pulmonary ROS,    Pulmonary exam normal breath sounds clear to auscultation       Cardiovascular hypertension, Pt. on medications  Rhythm:Regular Rate:Normal     Neuro/Psych  Headaches, negative psych ROS   GI/Hepatic Neg liver ROS, GERD  Medicated,  Endo/Other  negative endocrine ROS  Renal/GU negative Renal ROS  negative genitourinary   Musculoskeletal  (+) Arthritis , Osteoarthritis,    Abdominal   Peds  Hematology negative hematology ROS (+)   Anesthesia Other Findings   Reproductive/Obstetrics negative OB ROS                            Anesthesia Physical Anesthesia Plan  ASA: II  Anesthesia Plan: Spinal   Post-op Pain Management:    Induction: Intravenous  Airway Management Planned: Simple Face Mask  Additional Equipment:   Intra-op Plan:   Post-operative Plan:   Informed Consent: I have reviewed the patients History and Physical, chart, labs and discussed the procedure including the risks, benefits and alternatives for the proposed anesthesia with the patient or authorized representative who has indicated his/her understanding and acceptance.   Dental advisory given  Plan Discussed with: CRNA and Surgeon  Anesthesia Plan Comments:        Anesthesia Quick Evaluation

## 2014-11-13 NOTE — H&P (Signed)
PREOPERATIVE H&P  Chief Complaint: djd right knee  HPI: Samantha Schwartz is a 69 y.o. female who presents for preoperative history and physical with a diagnosis of djd right knee. Symptoms are rated as moderate to severe, and have been worsening.  This is significantly impairing activities of daily living.  She has elected for surgical management.   She has failed injections, activity modification, anti-inflammatories, and assistive devices.  Preoperative X-rays demonstrate end stage degenerative changes with osteophyte formation, loss of joint space, subchondral sclerosis.   Past Medical History  Diagnosis Date  . Hypertension   . Shortness of breath dyspnea   . GERD (gastroesophageal reflux disease)   . Headache   . Arthritis    Past Surgical History  Procedure Laterality Date  . Tubal ligation    . Cholecystectomy    . Hernia repair    . Knee arthroscopy Right   .  arm surgery Right   . Appendectomy     Social History   Social History  . Marital Status: Unknown    Spouse Name: N/A  . Number of Children: N/A  . Years of Education: N/A   Social History Main Topics  . Smoking status: Never Smoker   . Smokeless tobacco: Not on file  . Alcohol Use: No  . Drug Use: No  . Sexual Activity: Not on file   Other Topics Concern  . Not on file   Social History Narrative  . No narrative on file   No family history on file. Allergies  Allergen Reactions  . Celebrex [Celecoxib] Rash  . Sulfur Rash   Prior to Admission medications   Medication Sig Start Date End Date Taking? Authorizing Provider  amLODipine (NORVASC) 5 MG tablet Take 5 mg by mouth daily.   Yes Historical Provider, MD  aspirin 81 MG tablet Take 81 mg by mouth daily.   Yes Historical Provider, MD  Calcium Carb-Cholecalciferol (CALCIUM 600+D3) 600-800 MG-UNIT TABS Take 1 tablet by mouth 2 (two) times daily.   Yes Historical Provider, MD  Cranberry-Vitamin C-Vitamin E (CRANBERRY PLUS VITAMIN C) 4200-20-3  MG-MG-UNIT CAPS Take 2 capsules by mouth daily.   Yes Historical Provider, MD  diclofenac (VOLTAREN) 50 MG EC tablet Take 50 mg by mouth daily.   Yes Historical Provider, MD  diphenhydramine-acetaminophen (TYLENOL PM) 25-500 MG TABS tablet Take 2 tablets by mouth at bedtime.   Yes Historical Provider, MD  DULoxetine (CYMBALTA) 30 MG capsule Take 30 mg by mouth daily.   Yes Historical Provider, MD  esomeprazole (NEXIUM) 20 MG capsule Take 20 mg by mouth daily at 12 noon.   Yes Historical Provider, MD  GLUCOSAMINE HCL-MSM PO Take 1,500 mg by mouth 2 (two) times daily.   Yes Historical Provider, MD  Multiple Vitamins-Minerals (MULTIVITAMIN WITH MINERALS) tablet Take 1 tablet by mouth daily.   Yes Historical Provider, MD  omega-3 fish oil (MAXEPA) 1000 MG CAPS capsule Take 1 capsule by mouth 2 (two) times daily.   Yes Historical Provider, MD  Potassium 99 MG TABS Take 2 tablets by mouth daily.   Yes Historical Provider, MD  valsartan-hydrochlorothiazide (DIOVAN-HCT) 160-12.5 MG tablet Take 1 tablet by mouth daily.   Yes Historical Provider, MD     Positive ROS: All other systems have been reviewed and were otherwise negative with the exception of those mentioned in the HPI and as above.  Physical Exam: General: Alert, no acute distress Cardiovascular: No pedal edema Respiratory: No cyanosis, no use of accessory musculature GI: No  organomegaly, abdomen is soft and non-tender Skin: No lesions in the area of chief complaint Neurologic: Sensation intact distally Psychiatric: Patient is competent for consent with normal mood and affect Lymphatic: No axillary or cervical lymphadenopathy  MUSCULOSKELETAL: Right knee has severe valgus alignment with range of motion from 3 to 100. Positive pseudo-varus, positive crepitance.  Assessment: Right knee end-stage valgus osteoarthritis   Plan: Plan for Procedure(s): TOTAL KNEE ARTHROPLASTY  The risks benefits and alternatives were discussed with the  patient including but not limited to the risks of nonoperative treatment, versus surgical intervention including infection, bleeding, nerve injury,  blood clots, cardiopulmonary complications, morbidity, mortality, among others, and they were willing to proceed.   Johnny Bridge, MD Cell [phone]   11/13/2014 8:50 AM

## 2014-11-13 NOTE — Transfer of Care (Signed)
Immediate Anesthesia Transfer of Care Note  Patient: Samantha Schwartz  Procedure(s) Performed: Procedure(s): TOTAL KNEE ARTHROPLASTY (Right)  Patient Location: PACU  Anesthesia Type:MAC and Spinal  Level of Consciousness: awake, alert , oriented and patient cooperative  Airway & Oxygen Therapy: Patient Spontanous Breathing and Patient connected to nasal cannula oxygen  Post-op Assessment: Report given to RN and Post -op Vital signs reviewed and stable  Post vital signs: Reviewed  Last Vitals:  Filed Vitals:   11/13/14 0949  BP: 154/65  Pulse: 82  Temp: 36.6 C  Resp: 20    Complications: No apparent anesthesia complications

## 2014-11-14 ENCOUNTER — Encounter (HOSPITAL_COMMUNITY): Payer: Self-pay | Admitting: Orthopedic Surgery

## 2014-11-14 LAB — BASIC METABOLIC PANEL
ANION GAP: 11 (ref 5–15)
BUN: 14 mg/dL (ref 6–20)
CHLORIDE: 95 mmol/L — AB (ref 101–111)
CO2: 26 mmol/L (ref 22–32)
Calcium: 8.9 mg/dL (ref 8.9–10.3)
Creatinine, Ser: 0.69 mg/dL (ref 0.44–1.00)
GFR calc non Af Amer: >60 mL/min (ref >60)
Glucose, Bld: 144 mg/dL — ABNORMAL HIGH (ref 65–99)
POTASSIUM: 3.9 mmol/L (ref 3.5–5.1)
SODIUM: 132 mmol/L — AB (ref 135–145)

## 2014-11-14 LAB — CBC
HCT: 30.4 % — ABNORMAL LOW (ref 36.0–46.0)
HEMOGLOBIN: 10.1 g/dL — AB (ref 12.0–15.0)
MCH: 29.3 pg (ref 26.0–34.0)
MCHC: 33.2 g/dL (ref 30.0–36.0)
MCV: 88.1 fL (ref 78.0–100.0)
Platelets: 300 10*3/uL (ref 150–400)
RBC: 3.45 MIL/uL — AB (ref 3.87–5.11)
RDW: 12.4 % (ref 11.5–15.5)
WBC: 9.2 10*3/uL (ref 4.0–10.5)

## 2014-11-14 NOTE — Progress Notes (Signed)
Patient ID: Samantha Schwartz, female   DOB: 11-29-1945, 69 y.o.   MRN: [id number]     Subjective:  Patient reports pain as mild.  Patient in bed and resting well.  Denies any CP or SOB   Objective:   VITALS:   Filed Vitals:   11/13/14 1828 11/13/14 2045 11/13/14 2330 11/14/14 0520  BP: 140/63 104/79 113/80 135/64  Pulse: 78 80 74 94  Temp:  98.2 F (36.8 C) 98.1 F (36.7 C) 98.2 F (36.8 C)  TempSrc:  Oral Oral Oral  Resp:  18 18 18   Height:      Weight:      SpO2: 95% 97% 97% 94%    ABD soft Sensation intact distally Dorsiflexion/Plantar flexion intact Incision: dressing C/D/I and no drainage Foot and ankle swelling  Good foot and ankle motion   Lab Results  Component Value Date   WBC 6.9 11/05/2014   HGB 12.4 11/05/2014   HCT 37.8 11/05/2014   MCV 87.9 11/05/2014   PLT 372 11/05/2014   BMET    Component Value Date/Time   NA 135 11/05/2014 1038   K 4.0 11/05/2014 1038   CL 101 11/05/2014 1038   CO2 25 11/05/2014 1038   GLUCOSE 107* 11/05/2014 1038   BUN 12 11/05/2014 1038   CREATININE 0.88 11/05/2014 1038   CALCIUM 9.5 11/05/2014 1038   GFRNONAA >60 11/05/2014 1038   GFRAA >60 11/05/2014 1038     Assessment/Plan: 1 Day Post-Op   Principal Problem:   Primary localized osteoarthritis of right knee Active Problems:   Knee osteoarthritis   Advance diet Up with therapy Plan for discharge tomorrow or Friday WBAT Dry dressing PRN    Rande Brunt, BRANDON 11/14/2014, 7:16 AM  Seen and agree.  Marchia Bond, MD Cell [phone]

## 2014-11-14 NOTE — Care Management Note (Signed)
Case Management Note  Patient Details  Name: Samantha Schwartz MRN: [id number] Date of Birth: Jun 22, 1945  Subjective/Objective:              S/p right total knee arthroplasty      Action/Plan: Set up with [facility] by MD office for HHPT. Spoke with patient, no change in discharge plan. Patient stated that she has a rolling walker and 3N1 at home and will have family to assist after discharge.    Expected Discharge Date:                  Expected Discharge Plan:  [place]  In-House Referral:  NA  Discharge planning Services  CM Consult  Post Acute Care Choice:  Home Health Choice offered to:  Patient  DME Arranged:    DME Agency:     HH Arranged:  PT [place]:  [place]  Status of Service:  Completed, signed off  Medicare Important Message Given:    Date Medicare IM Given:    Medicare IM give by:    Date Additional Medicare IM Given:    Additional Medicare Important Message give by:     If discussed at [place] of Stay Meetings, dates discussed:    Additional Comments:  Nila Nephew, RN 11/14/2014, 11:16 AM

## 2014-11-14 NOTE — Evaluation (Signed)
Occupational Therapy Evaluation Patient Details Name: Samantha Schwartz MRN: [id number] DOB: Jun 21, 1945 Today's Date: 11/14/2014    History of Present Illness 69 y.o. female admitted to [facility] on 11/13/14 for elective R TKA.  Pt with significant PMHx of HTN, SOB, HA, and R arm surgery.   Clinical Impression   Pt was independent prior to admission in ADL and ambulating with a RW just prior to sx.  Pt presents with impaired standing balance and requires min assist for LB ADL.  All education completed as detailed below and pt with no other questions. She has excellent support of her family.    Follow Up Recommendations  No OT follow up    Equipment Recommendations  None recommended by OT    Recommendations for Other Services       Precautions / Restrictions Precautions Precautions: Knee Restrictions RLE Weight Bearing: Weight bearing as tolerated      Mobility Bed Mobility Overal bed mobility: Needs Assistance Bed Mobility: Supine to Sit;Sit to Supine     Supine to sit: Min assist Sit to supine: Min assist      Transfers Overall transfer level: Needs assistance Equipment used: Rolling walker (2 wheeled) Transfers: Sit to/from Stand Sit to Stand: Supervision              Balance     Sitting balance-Leahy Scale: Good       Standing balance-Leahy Scale: Fair                              ADL Overall ADL's : Needs assistance/impaired Eating/Feeding: Independent;Sitting   Grooming: Wash/dry hands;Supervision/safety;Standing   Upper Body Bathing: Set up;Sitting   Lower Body Bathing: Minimal assistance;Sit to/from stand Lower Body Bathing Details (indicate cue type and reason): instructed in use of long handled sponge and use of 3 in 1 as shower seat Upper Body Dressing : Set up;Sitting   Lower Body Dressing: Minimal assistance;Sit to/from stand Lower Body Dressing Details (indicate cue type and reason): instructed in availability/use of AE   Toilet Transfer: Supervision/safety;BSC;Ambulation (BSC over toilet)   Toileting- Clothing Manipulation and Hygiene: Supervision/safety;Sit to/from stand       Functional mobility during ADLs: Supervision/safety;Rolling walker General ADL Comments: Educated in safe footwear and transporting items with RW.     Vision     Perception     Praxis      Pertinent Vitals/Pain Pain Assessment: Faces Faces Pain Scale: Hurts little more Pain Location: R knee Pain Descriptors / Indicators: Grimacing;Guarding Pain Intervention(s): Monitored during session;Premedicated before session;Repositioned;Limited activity within patient's tolerance     Hand Dominance Right   Extremity/Trunk Assessment Upper Extremity Assessment Upper Extremity Assessment: Overall WFL for tasks assessed   Lower Extremity Assessment Lower Extremity Assessment: Defer to PT evaluation       Communication Communication Communication: No difficulties   Cognition Arousal/Alertness: Awake/alert Behavior During Therapy: WFL for tasks assessed/performed Overall Cognitive Status: Within Functional Limits for tasks assessed                     General Comments       Exercises       Shoulder Instructions      Home Living Family/patient expects to be discharged to:: Private residence Living Arrangements: Spouse/significant other Available Help at Discharge: Family;Available 24 hours/day Type of Home: House Home Access: Stairs to enter [employer] of Steps: 1 Entrance Stairs-Rails: None Home Layout: One level  Bathroom Shower/Tub: Occupational psychologist: Handicapped height     Home Equipment: Environmental consultant - 2 wheels;Cane - single point;Bedside commode          Prior Functioning/Environment Level of Independence: Independent with assistive device(s)        Comments: ended up having to walk with RW for the last two weeks prior to surgery.     OT Diagnosis:     OT  Problem List:     OT Treatment/Interventions:      OT Goals(Current goals can be found in the care plan section) Acute Rehab OT Goals Patient Stated Goal: to get her knee better in time for Thanksgiving (because she likes to cook)  OT Frequency:     Barriers to D/C:            Co-evaluation              End of Session    Activity Tolerance:   Patient left: in bed;with call bell/phone within reach;with family/visitor present   Time: 1543-1600 OT Time Calculation (min): 17 min Charges:  OT General Charges $OT Visit: 1 Procedure OT Evaluation $Initial OT Evaluation Tier I: 1 Procedure G-Codes:    Malka So 11/14/2014, 4:11 PM  [phone]

## 2014-11-14 NOTE — Progress Notes (Signed)
Physical Therapy Treatment Patient Details Name: Samantha Schwartz MRN: [id number] DOB: Apr 19, 1945 Today's Date: 11/14/2014    History of Present Illness 69 y.o. female admitted to [facility] on 11/13/14 for elective R TKA.  Pt with significant PMHx of HTN, SOB, HA, and R arm surgery.    PT Comments    Pt is POD #1 and is progressing well with gait and mobility.  She is on course to d/c home with family's assist.  PT will continue to follow acutely and progress education, gait training and exercises.   Follow Up Recommendations  Home health PT;Supervision for mobility/OOB     Equipment Recommendations  None recommended by PT    Recommendations for Other Services   NA     Precautions / Restrictions Precautions Precautions: Knee Precaution Booklet Issued: Yes (comment) Precaution Comments: knee precautions reviewed, handout given by PT Required Braces or Orthoses: Knee Immobilizer - Right Knee Immobilizer - Right: On when out of bed or walking Restrictions Weight Bearing Restrictions: No RLE Weight Bearing: Weight bearing as tolerated    Mobility  Bed Mobility Overal bed mobility: Needs Assistance Bed Mobility: Supine to Sit     Supine to sit: Min guard Sit to supine: Min assist   General bed mobility comments: Min guard assist to help lightly support right knee when progressing to EOB.   Transfers Overall transfer level: Needs assistance Equipment used: Rolling walker (2 wheeled) Transfers: Sit to/from Stand Sit to Stand: Min guard         General transfer comment: Min guard assist for safety and to help stabilize RW during transitions.  Verbal cues for safe upper extremity placement.   Ambulation/Gait Ambulation/Gait assistance: Min guard Ambulation Distance (Feet): 200 Feet Assistive device: Rolling walker (2 wheeled) Gait Pattern/deviations: Step-through pattern;Antalgic;Trunk flexed   Gait velocity interpretation: at or above normal speed for  age/gender General Gait Details: verbal cues to slow down speed, for upright posture, and for safe use of RW.            Balance Overall balance assessment: Needs assistance Sitting-balance support: Feet supported;No upper extremity supported Sitting balance-Leahy Scale: Good     Standing balance support: Bilateral upper extremity supported;No upper extremity supported;Single extremity supported Standing balance-Leahy Scale: Fair                      Cognition Arousal/Alertness: Awake/alert Behavior During Therapy: WFL for tasks assessed/performed Overall Cognitive Status: Within Functional Limits for tasks assessed                      Exercises Total Joint Exercises Ankle Circles/Pumps: AROM;Both;20 reps;Supine Quad Sets: AROM;Right;10 reps;Supine Towel Squeeze: AROM;Both;10 reps;Supine Heel Slides: AAROM;Right;10 reps;Supine Goniometric ROM: 10-65        Pertinent Vitals/Pain Pain Assessment: 0-10 Pain Score: 3  Faces Pain Scale: Hurts little more Pain Location: right knee Pain Descriptors / Indicators: Aching;Burning Pain Intervention(s): Limited activity within patient's tolerance;Monitored during session;Repositioned    Home Living Family/patient expects to be discharged to:: Private residence Living Arrangements: Spouse/significant other Available Help at Discharge: Family;Available 24 hours/day Type of Home: House Home Access: Stairs to enter Entrance Stairs-Rails: None Home Layout: One level Home Equipment: Environmental consultant - 2 wheels;Cane - single point;Bedside commode      Prior Function Level of Independence: Independent with assistive device(s)      Comments: ended up having to walk with RW for the last two weeks prior to surgery.    PT Goals (  current goals can now be found in the care plan section) Acute Rehab PT Goals Patient Stated Goal: to get her knee better in time for Thanksgiving (because she likes to cook) Progress towards PT  goals: Progressing toward goals    Frequency  7X/week    PT Plan Current plan remains appropriate       End of Session Equipment Utilized During Treatment: Right knee immobilizer Activity Tolerance: Patient tolerated treatment well Patient left: in chair;with call bell/phone within reach;with family/visitor present     Time: 8250-0370 PT Time Calculation (min) (ACUTE ONLY): 15 min  Charges:  $Gait Training: 8-22 mins                      Samantha Schwartz B. Samantha Schwartz, PT, DPT [phone]   11/14/2014, 5:19 PM

## 2014-11-14 NOTE — Progress Notes (Signed)
Physical Therapy Treatment Patient Details Name: Samantha Schwartz MRN: [id number] DOB: January 08, 1946 Today's Date: 11/14/2014    History of Present Illness 69 y.o. female admitted to [facility] on 11/13/14 for elective R TKA.  Pt with significant PMHx of HTN, SOB, HA, and R arm surgery.    PT Comments    Pt is POD #1 (seesion 2) and is moving well.  She is becoming safer with RW use and is now able to compensate and get into/out of bed without external assist.  PT will continue to follow acutely until d/c.  Try gait tomorrow without KI.   Follow Up Recommendations  Home health PT;Supervision for mobility/OOB     Equipment Recommendations  None recommended by PT    Recommendations for Other Services   NA     Precautions / Restrictions Precautions Precautions: Knee Precaution Booklet Issued: Yes (comment) Precaution Comments: knee precautions reviewed, handout given by PT Required Braces or Orthoses: Knee Immobilizer - Right Knee Immobilizer - Right: On when out of bed or walking Restrictions Weight Bearing Restrictions: No RLE Weight Bearing: Weight bearing as tolerated    Mobility  Bed Mobility Overal bed mobility: Needs Assistance Bed Mobility: Supine to Sit;Sit to Supine     Supine to sit: Modified independent (Device/Increase time) Sit to supine: Modified independent (Device/Increase time)   General bed mobility comments: Mod I after given blanket to help progress her own leg to EOB.  Pt was able to use this technique to get both into and out of the bed.   Transfers Overall transfer level: Needs assistance Equipment used: Rolling walker (2 wheeled) Transfers: Sit to/from Stand Sit to Stand: Supervision         General transfer comment: Supervision for safety, continued cues for safe hand placement.   Ambulation/Gait Ambulation/Gait assistance: Supervision Ambulation Distance (Feet): 200 Feet Assistive device: Rolling walker (2 wheeled) Gait Pattern/deviations:  Step-through pattern;Antalgic;Trunk flexed   Gait velocity interpretation: at or above normal speed for age/gender General Gait Details: Continued verbal cues for upright posture and safe use of RW.  Gait speed better controlled this PM session.           Balance Overall balance assessment: Needs assistance Sitting-balance support: No upper extremity supported;Feet supported Sitting balance-Leahy Scale: Good     Standing balance support: Bilateral upper extremity supported;No upper extremity supported;Single extremity supported Standing balance-Leahy Scale: Fair                      Cognition Arousal/Alertness: Awake/alert Behavior During Therapy: WFL for tasks assessed/performed Overall Cognitive Status: Within Functional Limits for tasks assessed                      Exercises Total Joint Exercises Ankle Circles/Pumps: AROM;Both;20 reps;Supine Quad Sets: AROM;Right;10 reps;Supine Towel Squeeze: AROM;Both;10 reps;Supine Short Arc Quad: AROM;Right;10 reps;Supine Heel Slides: AAROM;Right;10 reps;Supine Hip ABduction/ADduction: AROM;10 reps;Supine;Right Straight Leg Raises: AAROM;Right;10 reps;Supine Goniometric ROM: 10-65        Pertinent Vitals/Pain Pain Assessment: 0-10 Pain Score: 2  Faces Pain Scale: Hurts little more Pain Location: right knee Pain Descriptors / Indicators: Aching;Burning Pain Intervention(s): Limited activity within patient's tolerance;Monitored during session;Repositioned    Home Living Family/patient expects to be discharged to:: Private residence Living Arrangements: Spouse/significant other Available Help at Discharge: Family;Available 24 hours/day Type of Home: House Home Access: Stairs to enter Entrance Stairs-Rails: None Home Layout: One level Home Equipment: Environmental consultant - 2 wheels;Cane - single point;Bedside commode  Prior Function Level of Independence: Independent with assistive device(s)      Comments: ended  up having to walk with RW for the last two weeks prior to surgery.    PT Goals (current goals can now be found in the care plan section) Acute Rehab PT Goals Patient Stated Goal: to get her knee better in time for Thanksgiving (because she likes to cook) Progress towards PT goals: Progressing toward goals    Frequency  7X/week    PT Plan Current plan remains appropriate       End of Session Equipment Utilized During Treatment: Right knee immobilizer Activity Tolerance: Patient tolerated treatment well Patient left: in bed;with call bell/phone within reach;with family/visitor present     Time: 3007-6226 PT Time Calculation (min) (ACUTE ONLY): 16 min  Charges:  $Gait Training: 8-22 mins                      Samantha Schwartz, PT, DPT [phone]   11/14/2014, 5:25 PM

## 2014-11-14 NOTE — Discharge Instructions (Signed)
INSTRUCTIONS AFTER JOINT REPLACEMENT  ° °o Remove items at home which could result in a fall. This includes throw rugs or furniture in walking pathways °o ICE to the affected joint every three hours while awake for 30 minutes at a time, for at least the first 3-5 days, and then as needed for pain and swelling.  Continue to use ice for pain and swelling. You may notice swelling that will progress down to the foot and ankle.  This is normal after surgery.  Elevate your leg when you are not up walking on it.   °o Continue to use the breathing machine you got in the hospital (incentive spirometer) which will help keep your temperature down.  It is common for your temperature to cycle up and down following surgery, especially at night when you are not up moving around and exerting yourself.  The breathing machine keeps your lungs expanded and your temperature down. ° ° °DIET:  As you were doing prior to hospitalization, we recommend a well-balanced diet. ° °DRESSING / WOUND CARE / SHOWERING ° °You may change your dressing 3-5 days after surgery.  Then change the dressing every day with sterile gauze.  Please use good hand washing techniques before changing the dressing.  Do not use any lotions or creams on the incision until instructed by your surgeon. ° °ACTIVITY ° °o Increase activity slowly as tolerated, but follow the weight bearing instructions below.   °o No driving for 6 weeks or until further direction given by your physician.  You cannot drive while taking narcotics.  °o No lifting or carrying greater than 10 lbs. until further directed by your surgeon. °o Avoid periods of inactivity such as sitting longer than an hour when not asleep. This helps prevent blood clots.  °o You may return to work once you are authorized by your doctor.  ° ° ° °WEIGHT BEARING  ° °Weight bearing as tolerated with assist device (walker, cane, etc) as directed, use it as long as suggested by your surgeon or therapist, typically at  least 4-6 weeks. ° ° °EXERCISES ° °Results after joint replacement surgery are often greatly improved when you follow the exercise, range of motion and muscle strengthening exercises prescribed by your doctor. Safety measures are also important to protect the joint from further injury. Any time any of these exercises cause you to have increased pain or swelling, decrease what you are doing until you are comfortable again and then slowly increase them. If you have problems or questions, call your caregiver or physical therapist for advice.  ° °Rehabilitation is important following a joint replacement. After just a few days of immobilization, the muscles of the leg can become weakened and shrink (atrophy).  These exercises are designed to build up the tone and strength of the thigh and leg muscles and to improve motion. Often times heat used for twenty to thirty minutes before working out will loosen up your tissues and help with improving the range of motion but do not use heat for the first two weeks following surgery (sometimes heat can increase post-operative swelling).  ° °These exercises can be done on a training (exercise) mat, on the floor, on a table or on a bed. Use whatever works the best and is most comfortable for you.    Use music or television while you are exercising so that the exercises are a pleasant break in your day. This will make your life better with the exercises acting as a break   in your routine that you can look forward to.   Perform all exercises about fifteen times, three times per day or as directed.  You should exercise both the operative leg and the other leg as well. ° °Exercises include: °  °• Quad Sets - Tighten up the muscle on the front of the thigh (Quad) and hold for 5-10 seconds.   °• Straight Leg Raises - With your knee straight (if you were given a brace, keep it on), lift the leg to 60 degrees, hold for 3 seconds, and slowly lower the leg.  Perform this exercise against  resistance later as your leg gets stronger.  °• Leg Slides: Lying on your back, slowly slide your foot toward your buttocks, bending your knee up off the floor (only go as far as is comfortable). Then slowly slide your foot back down until your leg is flat on the floor again.  °• Angel Wings: Lying on your back spread your legs to the side as far apart as you can without causing discomfort.  °• Hamstring Strength:  Lying on your back, push your heel against the floor with your leg straight by tightening up the muscles of your buttocks.  Repeat, but this time bend your knee to a comfortable angle, and push your heel against the floor.  You may put a pillow under the heel to make it more comfortable if necessary.  ° °A rehabilitation program following joint replacement surgery can speed recovery and prevent re-injury in the future due to weakened muscles. Contact your doctor or a physical therapist for more information on knee rehabilitation.  ° ° °CONSTIPATION ° °Constipation is defined medically as fewer than three stools per week and severe constipation as less than one stool per week.  Even if you have a regular bowel pattern at home, your normal regimen is likely to be disrupted due to multiple reasons following surgery.  Combination of anesthesia, postoperative narcotics, change in appetite and fluid intake all can affect your bowels.  ° °YOU MUST use at least one of the following options; they are listed in order of increasing strength to get the job done.  They are all available over the counter, and you may need to use some, POSSIBLY even all of these options:   ° °Drink plenty of fluids (prune juice may be helpful) and high fiber foods °Colace 100 mg by mouth twice a day  °Senokot for constipation as directed and as needed Dulcolax (bisacodyl), take with full glass of water  °Miralax (polyethylene glycol) once or twice a day as needed. ° °If you have tried all these things and are unable to have a bowel  movement in the first 3-4 days after surgery call either your surgeon or your primary doctor.   ° °If you experience loose stools or diarrhea, hold the medications until you stool forms back up.  If your symptoms do not get better within 1 week or if they get worse, check with your doctor.  If you experience "the worst abdominal pain ever" or develop nausea or vomiting, please contact the office immediately for further recommendations for treatment. ° ° °ITCHING:  If you experience itching with your medications, try taking only a single pain pill, or even half a pain pill at a time.  You can also use Benadryl over the counter for itching or also to help with sleep.  ° °TED HOSE STOCKINGS:  Use stockings on both legs until for at least 2 weeks or as   directed by physician office. They may be removed at night for sleeping.  MEDICATIONS:  See your medication summary on the After Visit Summary that nursing will review with you.  You may have some home medications which will be placed on hold until you complete the course of blood thinner medication.  It is important for you to complete the blood thinner medication as prescribed.  PRECAUTIONS:  If you experience chest pain or shortness of breath - call 911 immediately for transfer to the hospital emergency department.   If you develop a fever greater that 101 F, purulent drainage from wound, increased redness or drainage from wound, foul odor from the wound/dressing, or calf pain - CONTACT YOUR SURGEON.                                                   FOLLOW-UP APPOINTMENTS:  If you do not already have a post-op appointment, please call the office for an appointment to be seen by your surgeon.  Guidelines for how soon to be seen are listed in your After Visit Summary, but are typically between 1-4 weeks after surgery.  OTHER INSTRUCTIONS:   Knee Replacement:  Do not place pillow under knee, focus on keeping the knee straight while resting. CPM  instructions: 0-90 degrees, 2 hours in the morning, 2 hours in the afternoon, and 2 hours in the evening. Place foam block, curve side up under heel at all times except when in CPM or when walking.  DO NOT modify, tear, cut, or change the foam block in any way.  MAKE SURE YOU:   Understand these instructions.   Get help right away if you are not doing well or get worse.    Thank you for letting us be a part of your medical care team.  It is a privilege we respect greatly.  We hope these instructions will help you stay on track for a fast and full recovery!   Information on my medicine - XARELTO (Rivaroxaban)  This medication education was reviewed with me or my healthcare representative as part of my discharge preparation.  The pharmacist that spoke with me during my hospital stay was:  Romona Curls, [facility]  Why was Xarelto prescribed for you? Xarelto was prescribed for you to reduce the risk of blood clots forming after orthopedic surgery. The medical term for these abnormal blood clots is venous thromboembolism (VTE).  What do you need to know about xarelto ? Take your Xarelto ONCE DAILY at the same time every day. You may take it either with or without food.  If you have difficulty swallowing the tablet whole, you may crush it and mix in applesauce just prior to taking your dose.  Take Xarelto exactly as prescribed by your doctor and DO NOT stop taking Xarelto without talking to the doctor who prescribed the medication.  Stopping without other VTE prevention medication to take the place of Xarelto may increase your risk of developing a clot.  After discharge, you should have regular check-up appointments with your healthcare provider that is prescribing your Xarelto.    What do you do if you miss a dose? If you miss a dose, take it as soon as you remember on the same day then continue your regularly scheduled once daily regimen the next day. Do not take two doses of  Xarelto  on the same day.   Important Safety Information A possible side effect of Xarelto is bleeding. You should call your healthcare provider right away if you experience any of the following: ? Bleeding from an injury or your nose that does not stop. ? Unusual colored urine (red or dark brown) or unusual colored stools (red or black). ? Unusual bruising for unknown reasons. ? A serious fall or if you hit your head (even if there is no bleeding).  Some medicines may interact with Xarelto and might increase your risk of bleeding while on Xarelto. To help avoid this, consult your healthcare provider or pharmacist prior to using any new prescription or non-prescription medications, including herbals, vitamins, non-steroidal anti-inflammatory drugs (NSAIDs) and supplements.  This website has more information on Xarelto: [email or web address]

## 2014-11-15 LAB — CBC
HCT: 30.5 % — ABNORMAL LOW (ref 36.0–46.0)
HEMOGLOBIN: 9.9 g/dL — AB (ref 12.0–15.0)
MCH: 28.4 pg (ref 26.0–34.0)
MCHC: 32.5 g/dL (ref 30.0–36.0)
MCV: 87.4 fL (ref 78.0–100.0)
PLATELETS: 299 10*3/uL (ref 150–400)
RBC: 3.49 MIL/uL — AB (ref 3.87–5.11)
RDW: 12.1 % (ref 11.5–15.5)
WBC: 11.4 10*3/uL — AB (ref 4.0–10.5)

## 2014-11-15 NOTE — Plan of Care (Signed)
Problem: Phase II Progression Outcomes Goal: Ambulates Outcome: Completed/Met Date Met:  11/15/14 W/ front rolling Samantha Schwartz and standby assist  Problem: Phase III Progression Outcomes Goal: Pain controlled on oral analgesia Outcome: Completed/Met Date Met:  11/15/14 No IV pain medications needed this shift. Tolerates low dose oxycodone for pain relief.

## 2014-11-15 NOTE — Progress Notes (Signed)
Patient ID: Samantha Schwartz, female   DOB: December 30, 1945, 69 y.o.   MRN: [id number]     Subjective:  Patient reports pain as mild.  Patient up and walking in the room and would like to go home  Objective:   VITALS:   Filed Vitals:   11/14/14 1013 11/14/14 1300 11/14/14 2039 11/15/14 0629  BP: 134/57 134/58 130/60 125/65  Pulse: 85 88 84 88  Temp: 99 F (37.2 C) 98.1 F (36.7 C) 98.5 F (36.9 C) 98.3 F (36.8 C)  TempSrc: Oral Oral Oral Oral  Resp: 18 18 16 16   Height:      Weight:      SpO2: 93% 95% 94% 99%    ABD soft Sensation intact distally Dorsiflexion/Plantar flexion intact Incision: dressing C/D/I and no drainage New dressing applied and wound good  Lab Results  Component Value Date   WBC 11.4* 11/15/2014   HGB 9.9* 11/15/2014   HCT 30.5* 11/15/2014   MCV 87.4 11/15/2014   PLT 299 11/15/2014   BMET    Component Value Date/Time   NA 132* 11/14/2014 0615   K 3.9 11/14/2014 0615   CL 95* 11/14/2014 0615   CO2 26 11/14/2014 0615   GLUCOSE 144* 11/14/2014 0615   BUN 14 11/14/2014 0615   CREATININE 0.69 11/14/2014 0615   CALCIUM 8.9 11/14/2014 0615   GFRNONAA >60 11/14/2014 0615   GFRAA >60 11/14/2014 0615     Assessment/Plan: 2 Days Post-Op   Principal Problem:   Primary localized osteoarthritis of right knee Active Problems:   Knee osteoarthritis   Advance diet Up with therapy Discharge home with home health WBAT Dry dressing PRN Follow up with Dr Mardelle Matte in two weeks   Rande Brunt, Erlene Quan 11/15/2014, 7:04 AM  Discussed and agree with above.  Dc home.   Marchia Bond, MD Cell [phone]

## 2014-11-15 NOTE — Discharge Summary (Signed)
Physician Discharge Summary  Patient ID: Samantha Schwartz MRN: [id number] DOB/AGE: 1945/10/21 69 y.o.  Admit date: 11/13/2014 Discharge date: 11/15/2014  Admission Diagnoses:  Primary localized osteoarthritis of right knee  Discharge Diagnoses:  Principal Problem:   Primary localized osteoarthritis of right knee Active Problems:   Knee osteoarthritis ABLA, mild, observed  Past Medical History  Diagnosis Date  . Hypertension   . Shortness of breath dyspnea   . GERD (gastroesophageal reflux disease)   . Headache   . Arthritis   . Primary localized osteoarthritis of right knee 11/13/2014    Surgeries: Procedure(s): TOTAL KNEE ARTHROPLASTY on 11/13/2014   Consultants (if any):    Discharged Condition: Improved  Hospital Course: Samantha Schwartz is an 69 y.o. female who was admitted 11/13/2014 with a diagnosis of Primary localized osteoarthritis of right knee and went to the operating room on 11/13/2014 and underwent the above named procedures.    She was given perioperative antibiotics:  Anti-infectives    Start     Dose/Rate Route Frequency Ordered Stop   11/13/14 1800  ceFAZolin (ANCEF) IVPB 2 g/50 mL premix     2 g 100 mL/hr over 30 Minutes Intravenous Every 6 hours 11/13/14 1744 11/14/14 0559   11/13/14 1045  ceFAZolin (ANCEF) IVPB 2 g/50 mL premix     2 g 100 mL/hr over 30 Minutes Intravenous To ShortStay Surgical 11/13/14 0954 11/13/14 1047   11/13/14 0955  ceFAZolin (ANCEF) 2-3 GM-% IVPB SOLR    Comments:  Jones, Tomika   : cabinet override      11/13/14 0955 11/13/14 2159    .  She was given sequential compression devices, early ambulation, and xarelto for DVT prophylaxis.  She benefited maximally from the hospital stay and there were no complications.    Recent vital signs:  Filed Vitals:   11/15/14 0629  BP: 125/65  Pulse: 88  Temp: 98.3 F (36.8 C)  Resp: 16    Recent laboratory studies:  Lab Results  Component Value Date   HGB 9.9*  11/15/2014   HGB 10.1* 11/14/2014   HGB 12.4 11/05/2014   Lab Results  Component Value Date   WBC 11.4* 11/15/2014   PLT 299 11/15/2014   No results found for: INR Lab Results  Component Value Date   NA 132* 11/14/2014   K 3.9 11/14/2014   CL 95* 11/14/2014   CO2 26 11/14/2014   BUN 14 11/14/2014   CREATININE 0.69 11/14/2014   GLUCOSE 144* 11/14/2014    Discharge Medications:     Medication List    STOP taking these medications        aspirin 81 MG tablet     diclofenac 50 MG EC tablet  Commonly known as:  VOLTAREN     diphenhydramine-acetaminophen 25-500 MG Tabs tablet  Commonly known as:  TYLENOL PM      TAKE these medications        amLODipine 5 MG tablet  Commonly known as:  NORVASC  Take 5 mg by mouth daily.     baclofen 10 MG tablet  Commonly known as:  LIORESAL  Take 1 tablet (10 mg total) by mouth 3 (three) times daily. As needed for muscle spasm     CALCIUM 600+D3 600-800 MG-UNIT Tabs  Generic drug:  Calcium Carb-Cholecalciferol  Take 1 tablet by mouth 2 (two) times daily.     CRANBERRY PLUS VITAMIN C 4200-20-3 MG-MG-UNIT Caps  Generic drug:  Cranberry-Vitamin C-Vitamin E  Take 2 capsules by  mouth daily.     DULoxetine 30 MG capsule  Commonly known as:  CYMBALTA  Take 30 mg by mouth daily.     esomeprazole 20 MG capsule  Commonly known as:  NEXIUM  Take 20 mg by mouth daily at 12 noon.     GLUCOSAMINE HCL-MSM PO  Take 1,500 mg by mouth 2 (two) times daily.     multivitamin with minerals tablet  Take 1 tablet by mouth daily.     omega-3 fish oil 1000 MG Caps capsule  Commonly known as:  MAXEPA  Take 1 capsule by mouth 2 (two) times daily.     ondansetron 4 MG tablet  Commonly known as:  ZOFRAN  Take 1 tablet (4 mg total) by mouth every 8 (eight) hours as needed for nausea or vomiting.     oxyCODONE-acetaminophen 5-325 MG tablet  Commonly known as:  ROXICET  Take 1-2 tablets by mouth every 6 (six) hours as needed for severe pain.      Potassium 99 MG Tabs  Take 2 tablets by mouth daily.     rivaroxaban 10 MG Tabs tablet  Commonly known as:  XARELTO  Take 1 tablet (10 mg total) by mouth daily.     sennosides-docusate sodium 8.6-50 MG tablet  Commonly known as:  SENOKOT-S  Take 2 tablets by mouth daily.     valsartan-hydrochlorothiazide 160-12.5 MG tablet  Commonly known as:  DIOVAN-HCT  Take 1 tablet by mouth daily.        Diagnostic Studies: Dg Knee Right Port  11/13/2014  CLINICAL DATA:  Postoperative right knee replacement EXAM: PORTABLE RIGHT KNEE - 1-2 VIEW COMPARISON:  None. FINDINGS: Right knee replacement is identified without malalignment. Postsurgical changes including joint fluid and air are noted. IMPRESSION: Right knee replacement without malalignment. Electronically Signed   By: Abelardo Diesel M.D.   On: 11/13/2014 15:18    Disposition: Final discharge disposition not confirmed        Follow-up Information    Follow up with Johnny Bridge, MD. Schedule an appointment as soon as possible for a visit in 2 weeks.   Specialty:  Orthopedic Surgery   Contact information:   [place] [address] [phone]       Follow up with [facility].   Why:  They will contact you to schedule home therapy visits.    Contact information:   [address] [place] [address] [phone]        Signed: Johnny Bridge 11/15/2014, 9:27 AM

## 2014-11-16 DIAGNOSIS — M1711 Unilateral primary osteoarthritis, right knee: Secondary | ICD-10-CM | POA: Diagnosis not present

## 2014-11-16 DIAGNOSIS — Z471 Aftercare following joint replacement surgery: Secondary | ICD-10-CM | POA: Diagnosis not present

## 2014-11-16 DIAGNOSIS — Z96651 Presence of right artificial knee joint: Secondary | ICD-10-CM | POA: Diagnosis not present

## 2014-11-16 DIAGNOSIS — I10 Essential (primary) hypertension: Secondary | ICD-10-CM | POA: Diagnosis not present

## 2014-11-16 DIAGNOSIS — Z7901 Long term (current) use of anticoagulants: Secondary | ICD-10-CM | POA: Diagnosis not present

## 2014-11-16 DIAGNOSIS — K219 Gastro-esophageal reflux disease without esophagitis: Secondary | ICD-10-CM | POA: Diagnosis not present

## 2014-11-16 NOTE — Progress Notes (Signed)
Physical Therapy Treatment Patient Details Name: Samantha Schwartz MRN: [id number] DOB: [birth year] Today's Date: 11/16/2014    History of Present Illness 69 y.o. female admitted to [facility] on 11/13/14 for elective R TKA.  Pt with significant PMHx of HTN, SOB, HA, and R arm surgery.    PT Comments    Pt was able to show safety on step simulating home entry.  Daughter present for education.  Last of HEP exercises reviewed.  Pt due to d/c home this PM.  PT will follow acutely until d/c confirmed.   Follow Up Recommendations  Home health PT;Supervision for mobility/OOB     Equipment Recommendations  None recommended by PT    Recommendations for Other Services   NA     Precautions / Restrictions Precautions Precautions: Knee Precaution Booklet Issued: Yes (comment) Precaution Comments: knee precautions reviewed, handout given by PT Required Braces or Orthoses: Knee Immobilizer - Right Knee Immobilizer - Right: On when out of bed or walking Restrictions RLE Weight Bearing: Weight bearing as tolerated    Mobility  Bed Mobility Overal bed mobility: Needs Assistance Bed Mobility: Supine to Sit     Supine to sit: Supervision     General bed mobility comments: supervision as pt is reporting increased pain and it is taking her longer to get leg EOB today.  Transfers Overall transfer level: Needs assistance Equipment used: Rolling walker (2 wheeled) Transfers: Sit to/from Stand Sit to Stand: Supervision         General transfer comment: supervision for safety due to slow speed of transitions and heavy reliance on upper extremity support.   Ambulation/Gait Ambulation/Gait assistance: Supervision Ambulation Distance (Feet): 450 Feet Assistive device: Rolling walker (2 wheeled) Gait Pattern/deviations: Step-through pattern;Antalgic;Trunk flexed     General Gait Details: Verbal cues for upright posture, although, pt self correcting a few times throughout session as well.   Verbal cues to start trying (although difficult with KI on) heel to toe gait pattern.     Stairs Stairs: Yes Stairs assistance: Supervision Stair Management: Forwards;Step to pattern;With walker;No rails Number of Stairs: 1 (x2) General stair comments: Verbal cues for LE sequencing and RW use during step management.  Daughter present for education and pt able to return/demonstrate safe/correct technique when practiced again.          Balance Overall balance assessment: Needs assistance Sitting-balance support: Feet supported;No upper extremity supported Sitting balance-Leahy Scale: Good     Standing balance support: Bilateral upper extremity supported Standing balance-Leahy Scale: Fair                      Cognition Arousal/Alertness: Awake/alert Behavior During Therapy: WFL for tasks assessed/performed Overall Cognitive Status: Within Functional Limits for tasks assessed                      Exercises Total Joint Exercises Long Arc Quad: AROM;Right;10 reps;Seated Knee Flexion: AROM;AAROM;Right;10 reps;Seated Goniometric ROM: 10-85        Pertinent Vitals/Pain Pain Assessment: 0-10 Pain Score: 3  Pain Location: right knee Pain Descriptors / Indicators: Burning;Aching Pain Intervention(s): Limited activity within patient's tolerance;Monitored during session;Repositioned           PT Goals (current goals can now be found in the care plan section) Acute Rehab PT Goals Patient Stated Goal: to get her knee better in time for Thanksgiving (because she likes to cook) Progress towards PT goals: Progressing toward goals    Frequency  7X/week  PT Plan Current plan remains appropriate    End of Session Equipment Utilized During Treatment: Right knee immobilizer Activity Tolerance: Patient tolerated treatment well Patient left: in chair;with call bell/phone within reach;with family/visitor present    Time: 1018-1040 PT Time Calculation (min)  (ACUTE ONLY): 22 min  Charges:  $Gait Training: 8-22 mins                      Cole Klugh B. Valree Feild, PT, DPT [phone]   11/16/2014, 6:25 PM

## 2014-11-19 DIAGNOSIS — Z471 Aftercare following joint replacement surgery: Secondary | ICD-10-CM | POA: Diagnosis not present

## 2014-11-19 DIAGNOSIS — Z7901 Long term (current) use of anticoagulants: Secondary | ICD-10-CM | POA: Diagnosis not present

## 2014-11-19 DIAGNOSIS — K219 Gastro-esophageal reflux disease without esophagitis: Secondary | ICD-10-CM | POA: Diagnosis not present

## 2014-11-19 DIAGNOSIS — I10 Essential (primary) hypertension: Secondary | ICD-10-CM | POA: Diagnosis not present

## 2014-11-19 DIAGNOSIS — Z96651 Presence of right artificial knee joint: Secondary | ICD-10-CM | POA: Diagnosis not present

## 2014-11-20 DIAGNOSIS — Z7901 Long term (current) use of anticoagulants: Secondary | ICD-10-CM | POA: Diagnosis not present

## 2014-11-20 DIAGNOSIS — I10 Essential (primary) hypertension: Secondary | ICD-10-CM | POA: Diagnosis not present

## 2014-11-20 DIAGNOSIS — Z471 Aftercare following joint replacement surgery: Secondary | ICD-10-CM | POA: Diagnosis not present

## 2014-11-20 DIAGNOSIS — K219 Gastro-esophageal reflux disease without esophagitis: Secondary | ICD-10-CM | POA: Diagnosis not present

## 2014-11-20 DIAGNOSIS — Z96651 Presence of right artificial knee joint: Secondary | ICD-10-CM | POA: Diagnosis not present

## 2014-11-21 DIAGNOSIS — K219 Gastro-esophageal reflux disease without esophagitis: Secondary | ICD-10-CM | POA: Diagnosis not present

## 2014-11-21 DIAGNOSIS — Z471 Aftercare following joint replacement surgery: Secondary | ICD-10-CM | POA: Diagnosis not present

## 2014-11-21 DIAGNOSIS — I10 Essential (primary) hypertension: Secondary | ICD-10-CM | POA: Diagnosis not present

## 2014-11-21 DIAGNOSIS — Z96651 Presence of right artificial knee joint: Secondary | ICD-10-CM | POA: Diagnosis not present

## 2014-11-21 DIAGNOSIS — Z7901 Long term (current) use of anticoagulants: Secondary | ICD-10-CM | POA: Diagnosis not present

## 2014-11-23 DIAGNOSIS — I10 Essential (primary) hypertension: Secondary | ICD-10-CM | POA: Diagnosis not present

## 2014-11-23 DIAGNOSIS — Z7901 Long term (current) use of anticoagulants: Secondary | ICD-10-CM | POA: Diagnosis not present

## 2014-11-23 DIAGNOSIS — Z96651 Presence of right artificial knee joint: Secondary | ICD-10-CM | POA: Diagnosis not present

## 2014-11-23 DIAGNOSIS — Z471 Aftercare following joint replacement surgery: Secondary | ICD-10-CM | POA: Diagnosis not present

## 2014-11-23 DIAGNOSIS — K219 Gastro-esophageal reflux disease without esophagitis: Secondary | ICD-10-CM | POA: Diagnosis not present

## 2014-11-26 DIAGNOSIS — Z96651 Presence of right artificial knee joint: Secondary | ICD-10-CM | POA: Diagnosis not present

## 2014-11-27 DIAGNOSIS — Z471 Aftercare following joint replacement surgery: Secondary | ICD-10-CM | POA: Diagnosis not present

## 2014-11-27 DIAGNOSIS — I10 Essential (primary) hypertension: Secondary | ICD-10-CM | POA: Diagnosis not present

## 2014-11-27 DIAGNOSIS — K219 Gastro-esophageal reflux disease without esophagitis: Secondary | ICD-10-CM | POA: Diagnosis not present

## 2014-11-27 DIAGNOSIS — Z7901 Long term (current) use of anticoagulants: Secondary | ICD-10-CM | POA: Diagnosis not present

## 2014-11-27 DIAGNOSIS — Z96651 Presence of right artificial knee joint: Secondary | ICD-10-CM | POA: Diagnosis not present

## 2014-11-28 DIAGNOSIS — Z96651 Presence of right artificial knee joint: Secondary | ICD-10-CM | POA: Diagnosis not present

## 2014-11-28 DIAGNOSIS — I10 Essential (primary) hypertension: Secondary | ICD-10-CM | POA: Diagnosis not present

## 2014-11-28 DIAGNOSIS — Z7901 Long term (current) use of anticoagulants: Secondary | ICD-10-CM | POA: Diagnosis not present

## 2014-11-28 DIAGNOSIS — Z471 Aftercare following joint replacement surgery: Secondary | ICD-10-CM | POA: Diagnosis not present

## 2014-11-28 DIAGNOSIS — K219 Gastro-esophageal reflux disease without esophagitis: Secondary | ICD-10-CM | POA: Diagnosis not present

## 2014-11-30 DIAGNOSIS — Z96651 Presence of right artificial knee joint: Secondary | ICD-10-CM | POA: Diagnosis not present

## 2014-11-30 DIAGNOSIS — I10 Essential (primary) hypertension: Secondary | ICD-10-CM | POA: Diagnosis not present

## 2014-11-30 DIAGNOSIS — Z7901 Long term (current) use of anticoagulants: Secondary | ICD-10-CM | POA: Diagnosis not present

## 2014-11-30 DIAGNOSIS — Z471 Aftercare following joint replacement surgery: Secondary | ICD-10-CM | POA: Diagnosis not present

## 2014-11-30 DIAGNOSIS — K219 Gastro-esophageal reflux disease without esophagitis: Secondary | ICD-10-CM | POA: Diagnosis not present

## 2014-12-03 DIAGNOSIS — R262 Difficulty in walking, not elsewhere classified: Secondary | ICD-10-CM | POA: Diagnosis not present

## 2014-12-03 DIAGNOSIS — Z96651 Presence of right artificial knee joint: Secondary | ICD-10-CM | POA: Diagnosis not present

## 2014-12-03 DIAGNOSIS — Z471 Aftercare following joint replacement surgery: Secondary | ICD-10-CM | POA: Diagnosis not present

## 2014-12-05 DIAGNOSIS — R262 Difficulty in walking, not elsewhere classified: Secondary | ICD-10-CM | POA: Diagnosis not present

## 2014-12-05 DIAGNOSIS — Z96651 Presence of right artificial knee joint: Secondary | ICD-10-CM | POA: Diagnosis not present

## 2014-12-05 DIAGNOSIS — Z471 Aftercare following joint replacement surgery: Secondary | ICD-10-CM | POA: Diagnosis not present

## 2014-12-10 DIAGNOSIS — Z96651 Presence of right artificial knee joint: Secondary | ICD-10-CM | POA: Diagnosis not present

## 2014-12-10 DIAGNOSIS — Z471 Aftercare following joint replacement surgery: Secondary | ICD-10-CM | POA: Diagnosis not present

## 2014-12-10 DIAGNOSIS — R262 Difficulty in walking, not elsewhere classified: Secondary | ICD-10-CM | POA: Diagnosis not present

## 2014-12-13 DIAGNOSIS — Z96651 Presence of right artificial knee joint: Secondary | ICD-10-CM | POA: Diagnosis not present

## 2014-12-13 DIAGNOSIS — Z471 Aftercare following joint replacement surgery: Secondary | ICD-10-CM | POA: Diagnosis not present

## 2014-12-13 DIAGNOSIS — R262 Difficulty in walking, not elsewhere classified: Secondary | ICD-10-CM | POA: Diagnosis not present

## 2014-12-17 DIAGNOSIS — Z96651 Presence of right artificial knee joint: Secondary | ICD-10-CM | POA: Diagnosis not present

## 2014-12-17 DIAGNOSIS — Z471 Aftercare following joint replacement surgery: Secondary | ICD-10-CM | POA: Diagnosis not present

## 2014-12-17 DIAGNOSIS — R262 Difficulty in walking, not elsewhere classified: Secondary | ICD-10-CM | POA: Diagnosis not present

## 2014-12-19 DIAGNOSIS — Z96651 Presence of right artificial knee joint: Secondary | ICD-10-CM | POA: Diagnosis not present

## 2014-12-19 DIAGNOSIS — Z471 Aftercare following joint replacement surgery: Secondary | ICD-10-CM | POA: Diagnosis not present

## 2014-12-19 DIAGNOSIS — R262 Difficulty in walking, not elsewhere classified: Secondary | ICD-10-CM | POA: Diagnosis not present

## 2014-12-24 DIAGNOSIS — Z96651 Presence of right artificial knee joint: Secondary | ICD-10-CM | POA: Diagnosis not present

## 2014-12-26 DIAGNOSIS — Z471 Aftercare following joint replacement surgery: Secondary | ICD-10-CM | POA: Diagnosis not present

## 2014-12-26 DIAGNOSIS — Z96651 Presence of right artificial knee joint: Secondary | ICD-10-CM | POA: Diagnosis not present

## 2014-12-26 DIAGNOSIS — R262 Difficulty in walking, not elsewhere classified: Secondary | ICD-10-CM | POA: Diagnosis not present

## 2015-02-06 DIAGNOSIS — Z96651 Presence of right artificial knee joint: Secondary | ICD-10-CM | POA: Diagnosis not present

## 2015-03-13 DIAGNOSIS — Z23 Encounter for immunization: Secondary | ICD-10-CM | POA: Diagnosis not present

## 2015-03-13 DIAGNOSIS — E7800 Pure hypercholesterolemia, unspecified: Secondary | ICD-10-CM | POA: Diagnosis not present

## 2015-03-13 DIAGNOSIS — Z0000 Encounter for general adult medical examination without abnormal findings: Secondary | ICD-10-CM | POA: Diagnosis not present

## 2015-03-13 DIAGNOSIS — I10 Essential (primary) hypertension: Secondary | ICD-10-CM | POA: Diagnosis not present

## 2015-03-13 DIAGNOSIS — K219 Gastro-esophageal reflux disease without esophagitis: Secondary | ICD-10-CM | POA: Diagnosis not present

## 2015-03-13 DIAGNOSIS — Z1389 Encounter for screening for other disorder: Secondary | ICD-10-CM | POA: Diagnosis not present

## 2015-03-18 DIAGNOSIS — R7309 Other abnormal glucose: Secondary | ICD-10-CM | POA: Diagnosis not present

## 2015-03-18 DIAGNOSIS — Z79899 Other long term (current) drug therapy: Secondary | ICD-10-CM | POA: Diagnosis not present

## 2015-03-18 DIAGNOSIS — E7800 Pure hypercholesterolemia, unspecified: Secondary | ICD-10-CM | POA: Diagnosis not present

## 2015-03-25 DIAGNOSIS — H5210 Myopia, unspecified eye: Secondary | ICD-10-CM | POA: Diagnosis not present

## 2015-03-25 DIAGNOSIS — H5203 Hypermetropia, bilateral: Secondary | ICD-10-CM | POA: Diagnosis not present

## 2015-03-25 DIAGNOSIS — H40053 Ocular hypertension, bilateral: Secondary | ICD-10-CM | POA: Diagnosis not present

## 2015-03-26 DIAGNOSIS — Z1231 Encounter for screening mammogram for malignant neoplasm of breast: Secondary | ICD-10-CM | POA: Diagnosis not present

## 2015-10-28 DIAGNOSIS — Z96651 Presence of right artificial knee joint: Secondary | ICD-10-CM | POA: Diagnosis not present

## 2015-10-28 DIAGNOSIS — M1711 Unilateral primary osteoarthritis, right knee: Secondary | ICD-10-CM | POA: Diagnosis not present

## 2016-04-08 DIAGNOSIS — E7800 Pure hypercholesterolemia, unspecified: Secondary | ICD-10-CM | POA: Diagnosis not present

## 2016-04-08 DIAGNOSIS — Z1389 Encounter for screening for other disorder: Secondary | ICD-10-CM | POA: Diagnosis not present

## 2016-04-08 DIAGNOSIS — K219 Gastro-esophageal reflux disease without esophagitis: Secondary | ICD-10-CM | POA: Diagnosis not present

## 2016-04-08 DIAGNOSIS — I10 Essential (primary) hypertension: Secondary | ICD-10-CM | POA: Diagnosis not present

## 2016-04-08 DIAGNOSIS — Z1231 Encounter for screening mammogram for malignant neoplasm of breast: Secondary | ICD-10-CM | POA: Diagnosis not present

## 2016-04-08 DIAGNOSIS — Z0000 Encounter for general adult medical examination without abnormal findings: Secondary | ICD-10-CM | POA: Diagnosis not present

## 2016-04-08 DIAGNOSIS — Z9181 History of falling: Secondary | ICD-10-CM | POA: Diagnosis not present

## 2016-04-08 DIAGNOSIS — R7309 Other abnormal glucose: Secondary | ICD-10-CM | POA: Diagnosis not present

## 2016-04-08 DIAGNOSIS — F418 Other specified anxiety disorders: Secondary | ICD-10-CM | POA: Diagnosis not present

## 2016-04-15 DIAGNOSIS — Z79899 Other long term (current) drug therapy: Secondary | ICD-10-CM | POA: Diagnosis not present

## 2016-04-15 DIAGNOSIS — E7800 Pure hypercholesterolemia, unspecified: Secondary | ICD-10-CM | POA: Diagnosis not present

## 2016-04-15 DIAGNOSIS — R7309 Other abnormal glucose: Secondary | ICD-10-CM | POA: Diagnosis not present

## 2016-04-27 DIAGNOSIS — B0239 Other herpes zoster eye disease: Secondary | ICD-10-CM | POA: Diagnosis not present

## 2016-04-30 DIAGNOSIS — B0239 Other herpes zoster eye disease: Secondary | ICD-10-CM | POA: Diagnosis not present

## 2016-05-07 DIAGNOSIS — B0239 Other herpes zoster eye disease: Secondary | ICD-10-CM | POA: Diagnosis not present

## 2016-05-20 DIAGNOSIS — M1711 Unilateral primary osteoarthritis, right knee: Secondary | ICD-10-CM | POA: Diagnosis not present

## 2016-06-08 DIAGNOSIS — Z1231 Encounter for screening mammogram for malignant neoplasm of breast: Secondary | ICD-10-CM | POA: Diagnosis not present

## 2016-06-20 DIAGNOSIS — S4291XA Fracture of right shoulder girdle, part unspecified, initial encounter for closed fracture: Secondary | ICD-10-CM | POA: Diagnosis not present

## 2016-06-22 DIAGNOSIS — S42291A Other displaced fracture of upper end of right humerus, initial encounter for closed fracture: Secondary | ICD-10-CM | POA: Diagnosis not present

## 2016-06-22 DIAGNOSIS — Z9889 Other specified postprocedural states: Secondary | ICD-10-CM | POA: Diagnosis not present

## 2016-06-22 DIAGNOSIS — S42292A Other displaced fracture of upper end of left humerus, initial encounter for closed fracture: Secondary | ICD-10-CM | POA: Diagnosis not present

## 2016-06-22 DIAGNOSIS — Z9181 History of falling: Secondary | ICD-10-CM | POA: Diagnosis not present

## 2016-06-22 DIAGNOSIS — M19012 Primary osteoarthritis, left shoulder: Secondary | ICD-10-CM | POA: Diagnosis not present

## 2016-06-22 DIAGNOSIS — S42295A Other nondisplaced fracture of upper end of left humerus, initial encounter for closed fracture: Secondary | ICD-10-CM | POA: Diagnosis not present

## 2016-07-13 DIAGNOSIS — S42255D Nondisplaced fracture of greater tuberosity of left humerus, subsequent encounter for fracture with routine healing: Secondary | ICD-10-CM | POA: Diagnosis not present

## 2016-07-13 DIAGNOSIS — Z9889 Other specified postprocedural states: Secondary | ICD-10-CM | POA: Diagnosis not present

## 2016-07-13 DIAGNOSIS — S42295D Other nondisplaced fracture of upper end of left humerus, subsequent encounter for fracture with routine healing: Secondary | ICD-10-CM | POA: Diagnosis not present

## 2016-07-13 DIAGNOSIS — M19012 Primary osteoarthritis, left shoulder: Secondary | ICD-10-CM | POA: Diagnosis not present

## 2016-07-20 DIAGNOSIS — M25512 Pain in left shoulder: Secondary | ICD-10-CM | POA: Diagnosis not present

## 2016-07-20 DIAGNOSIS — S42202D Unspecified fracture of upper end of left humerus, subsequent encounter for fracture with routine healing: Secondary | ICD-10-CM | POA: Diagnosis not present

## 2016-07-20 DIAGNOSIS — M25612 Stiffness of left shoulder, not elsewhere classified: Secondary | ICD-10-CM | POA: Diagnosis not present

## 2016-07-22 DIAGNOSIS — M25612 Stiffness of left shoulder, not elsewhere classified: Secondary | ICD-10-CM | POA: Diagnosis not present

## 2016-07-22 DIAGNOSIS — S42202D Unspecified fracture of upper end of left humerus, subsequent encounter for fracture with routine healing: Secondary | ICD-10-CM | POA: Diagnosis not present

## 2016-07-22 DIAGNOSIS — M25512 Pain in left shoulder: Secondary | ICD-10-CM | POA: Diagnosis not present

## 2016-07-27 DIAGNOSIS — M25612 Stiffness of left shoulder, not elsewhere classified: Secondary | ICD-10-CM | POA: Diagnosis not present

## 2016-07-27 DIAGNOSIS — S42202D Unspecified fracture of upper end of left humerus, subsequent encounter for fracture with routine healing: Secondary | ICD-10-CM | POA: Diagnosis not present

## 2016-07-27 DIAGNOSIS — M25512 Pain in left shoulder: Secondary | ICD-10-CM | POA: Diagnosis not present

## 2016-07-31 DIAGNOSIS — M25612 Stiffness of left shoulder, not elsewhere classified: Secondary | ICD-10-CM | POA: Diagnosis not present

## 2016-07-31 DIAGNOSIS — M25512 Pain in left shoulder: Secondary | ICD-10-CM | POA: Diagnosis not present

## 2016-07-31 DIAGNOSIS — S42202D Unspecified fracture of upper end of left humerus, subsequent encounter for fracture with routine healing: Secondary | ICD-10-CM | POA: Diagnosis not present

## 2016-08-03 DIAGNOSIS — M25512 Pain in left shoulder: Secondary | ICD-10-CM | POA: Diagnosis not present

## 2016-08-03 DIAGNOSIS — Z9889 Other specified postprocedural states: Secondary | ICD-10-CM | POA: Diagnosis not present

## 2016-08-03 DIAGNOSIS — S42295D Other nondisplaced fracture of upper end of left humerus, subsequent encounter for fracture with routine healing: Secondary | ICD-10-CM | POA: Diagnosis not present

## 2016-08-03 DIAGNOSIS — S42202D Unspecified fracture of upper end of left humerus, subsequent encounter for fracture with routine healing: Secondary | ICD-10-CM | POA: Diagnosis not present

## 2016-08-03 DIAGNOSIS — M19012 Primary osteoarthritis, left shoulder: Secondary | ICD-10-CM | POA: Diagnosis not present

## 2016-08-03 DIAGNOSIS — M25612 Stiffness of left shoulder, not elsewhere classified: Secondary | ICD-10-CM | POA: Diagnosis not present

## 2016-08-07 DIAGNOSIS — M25512 Pain in left shoulder: Secondary | ICD-10-CM | POA: Diagnosis not present

## 2016-08-07 DIAGNOSIS — M25612 Stiffness of left shoulder, not elsewhere classified: Secondary | ICD-10-CM | POA: Diagnosis not present

## 2016-08-07 DIAGNOSIS — S42202D Unspecified fracture of upper end of left humerus, subsequent encounter for fracture with routine healing: Secondary | ICD-10-CM | POA: Diagnosis not present

## 2016-08-11 DIAGNOSIS — M25612 Stiffness of left shoulder, not elsewhere classified: Secondary | ICD-10-CM | POA: Diagnosis not present

## 2016-08-11 DIAGNOSIS — M25512 Pain in left shoulder: Secondary | ICD-10-CM | POA: Diagnosis not present

## 2016-08-11 DIAGNOSIS — S42202D Unspecified fracture of upper end of left humerus, subsequent encounter for fracture with routine healing: Secondary | ICD-10-CM | POA: Diagnosis not present

## 2016-08-12 DIAGNOSIS — S42202D Unspecified fracture of upper end of left humerus, subsequent encounter for fracture with routine healing: Secondary | ICD-10-CM | POA: Diagnosis not present

## 2016-08-12 DIAGNOSIS — M25612 Stiffness of left shoulder, not elsewhere classified: Secondary | ICD-10-CM | POA: Diagnosis not present

## 2016-08-12 DIAGNOSIS — M25512 Pain in left shoulder: Secondary | ICD-10-CM | POA: Diagnosis not present

## 2016-08-18 DIAGNOSIS — M25512 Pain in left shoulder: Secondary | ICD-10-CM | POA: Diagnosis not present

## 2016-08-18 DIAGNOSIS — M25612 Stiffness of left shoulder, not elsewhere classified: Secondary | ICD-10-CM | POA: Diagnosis not present

## 2016-08-18 DIAGNOSIS — S42202D Unspecified fracture of upper end of left humerus, subsequent encounter for fracture with routine healing: Secondary | ICD-10-CM | POA: Diagnosis not present

## 2016-08-20 DIAGNOSIS — M25512 Pain in left shoulder: Secondary | ICD-10-CM | POA: Diagnosis not present

## 2016-08-20 DIAGNOSIS — M25612 Stiffness of left shoulder, not elsewhere classified: Secondary | ICD-10-CM | POA: Diagnosis not present

## 2016-08-20 DIAGNOSIS — S42202D Unspecified fracture of upper end of left humerus, subsequent encounter for fracture with routine healing: Secondary | ICD-10-CM | POA: Diagnosis not present

## 2016-08-25 DIAGNOSIS — M25512 Pain in left shoulder: Secondary | ICD-10-CM | POA: Diagnosis not present

## 2016-08-25 DIAGNOSIS — M25612 Stiffness of left shoulder, not elsewhere classified: Secondary | ICD-10-CM | POA: Diagnosis not present

## 2016-08-25 DIAGNOSIS — S42202D Unspecified fracture of upper end of left humerus, subsequent encounter for fracture with routine healing: Secondary | ICD-10-CM | POA: Diagnosis not present

## 2016-08-27 DIAGNOSIS — S42202D Unspecified fracture of upper end of left humerus, subsequent encounter for fracture with routine healing: Secondary | ICD-10-CM | POA: Diagnosis not present

## 2016-08-27 DIAGNOSIS — M25512 Pain in left shoulder: Secondary | ICD-10-CM | POA: Diagnosis not present

## 2016-08-27 DIAGNOSIS — M25612 Stiffness of left shoulder, not elsewhere classified: Secondary | ICD-10-CM | POA: Diagnosis not present

## 2016-10-07 IMAGING — CR DG KNEE 1-2V PORT*R*
2 series · 2 of 2 positions shown · non-contrast
Comparison: None.

CLINICAL DATA: Postoperative right knee replacement

EXAM:
PORTABLE RIGHT KNEE - 1-2 VIEW

[AP]
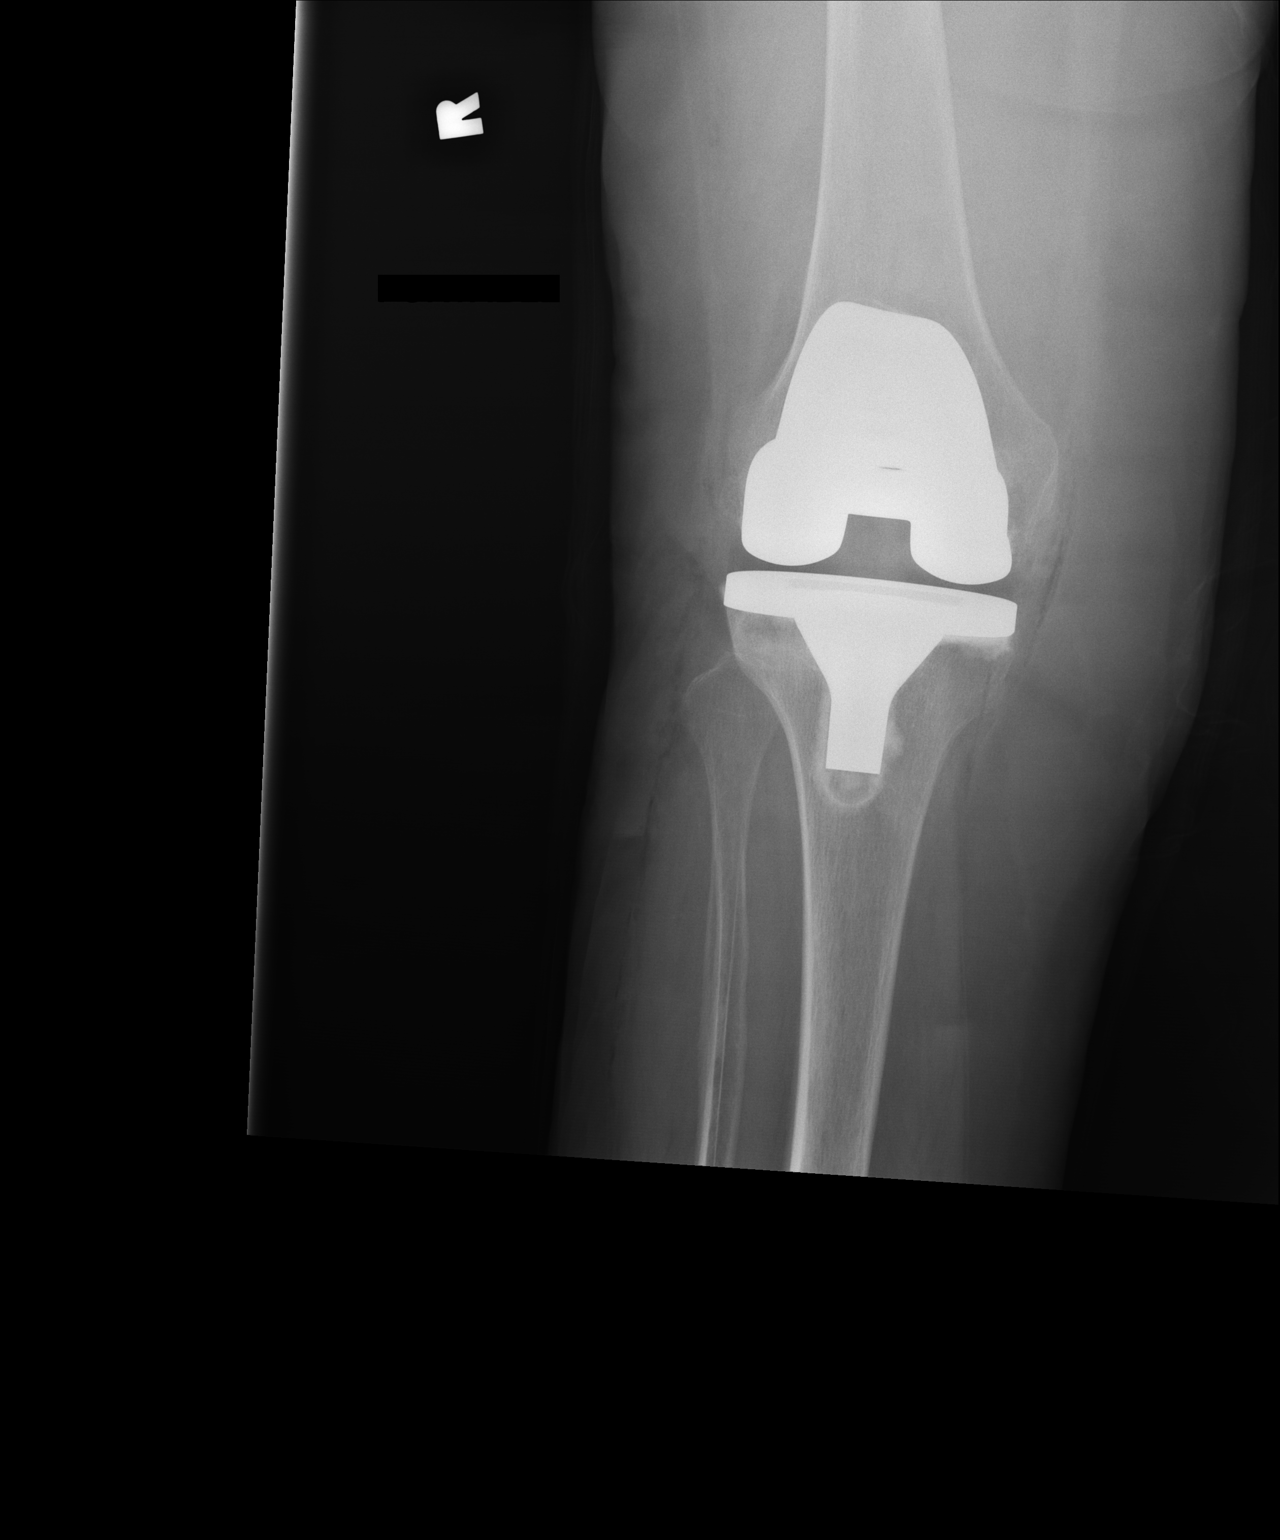

[xtable lateral]
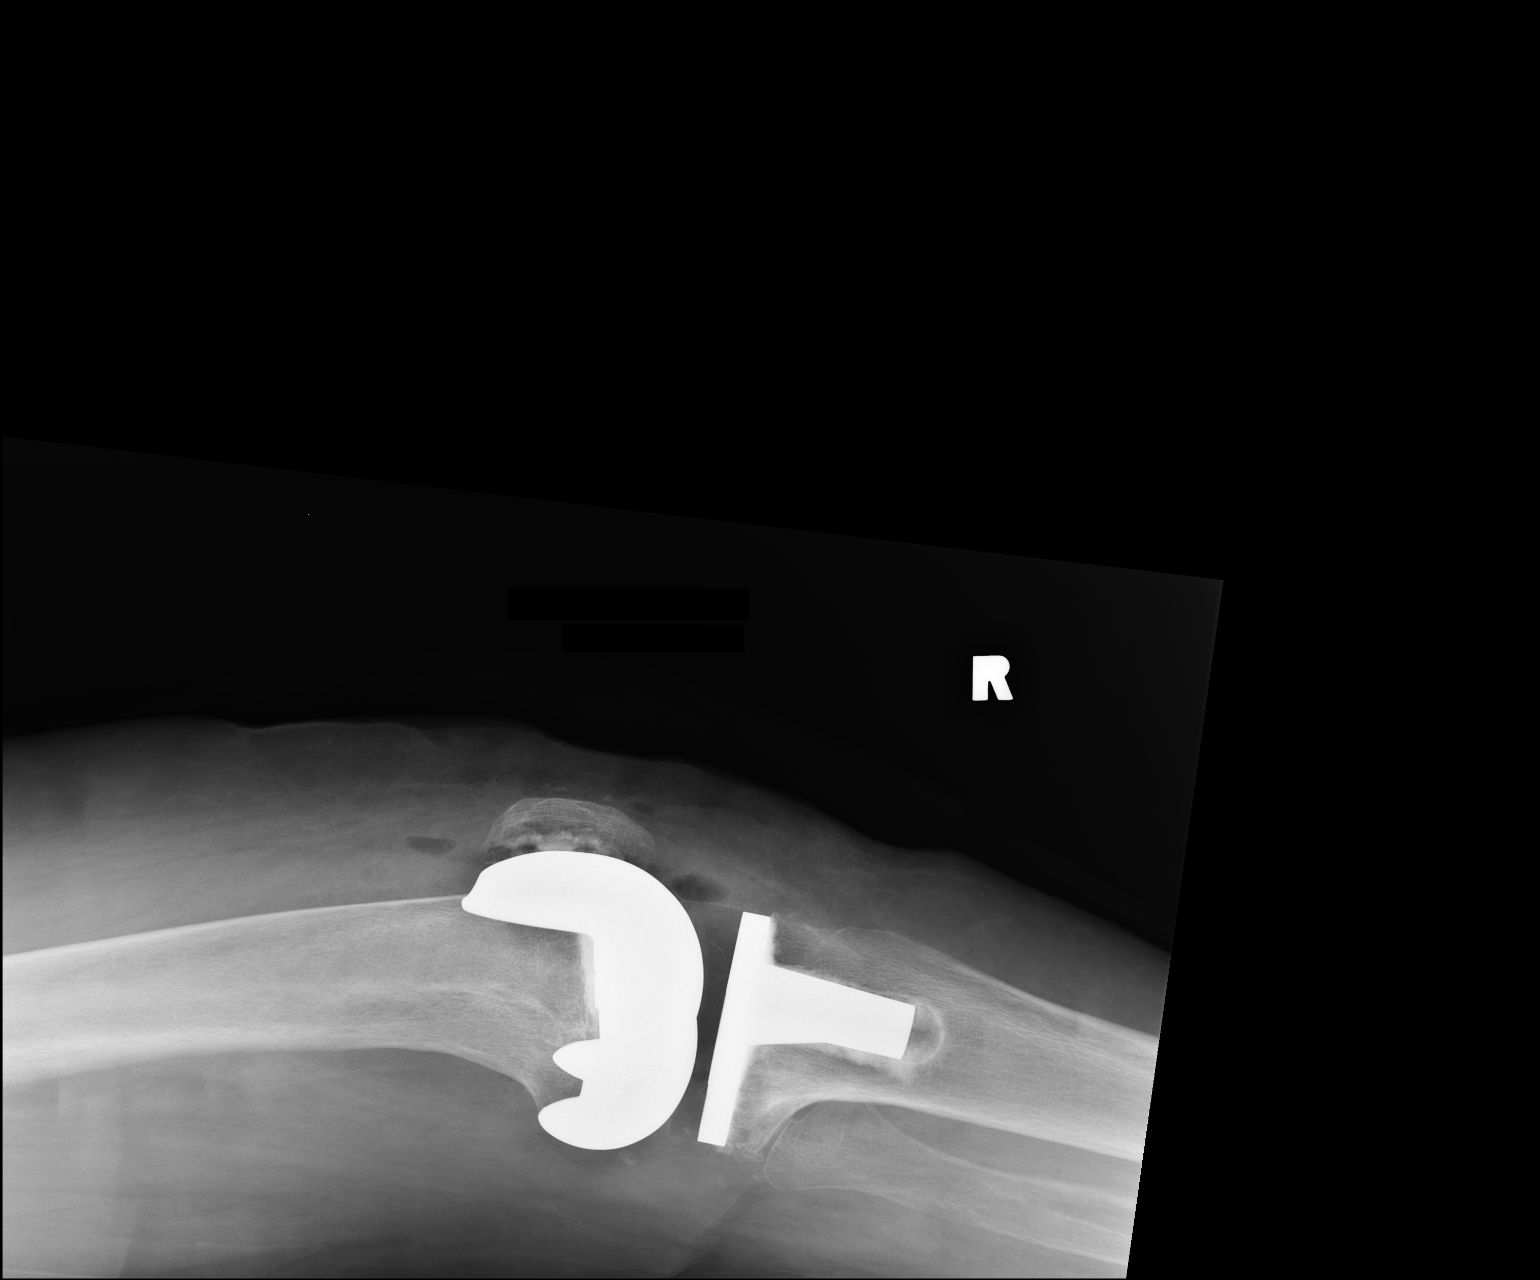

[2 of 2 positions shown; findings below may reference images not displayed]

FINDINGS: Right knee replacement is identified without malalignment.
Postsurgical changes including joint fluid and air are noted.
IMPRESSION: Right knee replacement without malalignment.

## 2016-12-23 DIAGNOSIS — M1711 Unilateral primary osteoarthritis, right knee: Secondary | ICD-10-CM | POA: Diagnosis not present

## 2017-01-01 DIAGNOSIS — Z23 Encounter for immunization: Secondary | ICD-10-CM | POA: Diagnosis not present

## 2017-04-13 DIAGNOSIS — R69 Illness, unspecified: Secondary | ICD-10-CM | POA: Diagnosis not present

## 2017-04-21 DIAGNOSIS — F329 Major depressive disorder, single episode, unspecified: Secondary | ICD-10-CM | POA: Diagnosis not present

## 2017-04-21 DIAGNOSIS — F419 Anxiety disorder, unspecified: Secondary | ICD-10-CM | POA: Diagnosis not present

## 2017-04-21 DIAGNOSIS — Z0000 Encounter for general adult medical examination without abnormal findings: Secondary | ICD-10-CM | POA: Diagnosis not present

## 2017-04-21 DIAGNOSIS — I10 Essential (primary) hypertension: Secondary | ICD-10-CM | POA: Diagnosis not present

## 2017-04-21 DIAGNOSIS — M8580 Other specified disorders of bone density and structure, unspecified site: Secondary | ICD-10-CM | POA: Diagnosis not present

## 2017-04-21 DIAGNOSIS — E7439 Other disorders of intestinal carbohydrate absorption: Secondary | ICD-10-CM | POA: Diagnosis not present

## 2017-04-21 DIAGNOSIS — E7800 Pure hypercholesterolemia, unspecified: Secondary | ICD-10-CM | POA: Diagnosis not present

## 2017-04-21 DIAGNOSIS — K581 Irritable bowel syndrome with constipation: Secondary | ICD-10-CM | POA: Diagnosis not present

## 2017-04-21 DIAGNOSIS — M2550 Pain in unspecified joint: Secondary | ICD-10-CM | POA: Diagnosis not present

## 2017-04-27 DIAGNOSIS — E7800 Pure hypercholesterolemia, unspecified: Secondary | ICD-10-CM | POA: Diagnosis not present

## 2017-04-27 DIAGNOSIS — E7439 Other disorders of intestinal carbohydrate absorption: Secondary | ICD-10-CM | POA: Diagnosis not present

## 2017-04-27 DIAGNOSIS — Z79899 Other long term (current) drug therapy: Secondary | ICD-10-CM | POA: Diagnosis not present

## 2017-07-26 DIAGNOSIS — Z6833 Body mass index (BMI) 33.0-33.9, adult: Secondary | ICD-10-CM | POA: Diagnosis not present

## 2017-07-26 DIAGNOSIS — Z1339 Encounter for screening examination for other mental health and behavioral disorders: Secondary | ICD-10-CM | POA: Diagnosis not present

## 2017-07-26 DIAGNOSIS — J9801 Acute bronchospasm: Secondary | ICD-10-CM | POA: Diagnosis not present

## 2017-08-18 DIAGNOSIS — M170 Bilateral primary osteoarthritis of knee: Secondary | ICD-10-CM | POA: Diagnosis not present

## 2017-09-17 DIAGNOSIS — Z1231 Encounter for screening mammogram for malignant neoplasm of breast: Secondary | ICD-10-CM | POA: Diagnosis not present

## 2017-09-29 DIAGNOSIS — M1712 Unilateral primary osteoarthritis, left knee: Secondary | ICD-10-CM | POA: Diagnosis not present

## 2017-09-29 DIAGNOSIS — Z96651 Presence of right artificial knee joint: Secondary | ICD-10-CM | POA: Diagnosis not present

## 2017-12-22 DIAGNOSIS — M1712 Unilateral primary osteoarthritis, left knee: Secondary | ICD-10-CM | POA: Diagnosis not present

## 2017-12-24 DIAGNOSIS — Z23 Encounter for immunization: Secondary | ICD-10-CM | POA: Diagnosis not present

## 2018-04-26 DIAGNOSIS — Z23 Encounter for immunization: Secondary | ICD-10-CM | POA: Diagnosis not present

## 2018-04-26 DIAGNOSIS — K219 Gastro-esophageal reflux disease without esophagitis: Secondary | ICD-10-CM | POA: Diagnosis not present

## 2018-04-26 DIAGNOSIS — Z0000 Encounter for general adult medical examination without abnormal findings: Secondary | ICD-10-CM | POA: Diagnosis not present

## 2018-04-26 DIAGNOSIS — M8580 Other specified disorders of bone density and structure, unspecified site: Secondary | ICD-10-CM | POA: Diagnosis not present

## 2018-04-26 DIAGNOSIS — I10 Essential (primary) hypertension: Secondary | ICD-10-CM | POA: Diagnosis not present

## 2018-04-26 DIAGNOSIS — F329 Major depressive disorder, single episode, unspecified: Secondary | ICD-10-CM | POA: Diagnosis not present

## 2018-04-26 DIAGNOSIS — E7800 Pure hypercholesterolemia, unspecified: Secondary | ICD-10-CM | POA: Diagnosis not present

## 2018-04-26 DIAGNOSIS — Z6835 Body mass index (BMI) 35.0-35.9, adult: Secondary | ICD-10-CM | POA: Diagnosis not present

## 2018-04-26 DIAGNOSIS — F419 Anxiety disorder, unspecified: Secondary | ICD-10-CM | POA: Diagnosis not present

## 2018-04-28 DIAGNOSIS — Z79899 Other long term (current) drug therapy: Secondary | ICD-10-CM | POA: Diagnosis not present

## 2018-04-28 DIAGNOSIS — E7800 Pure hypercholesterolemia, unspecified: Secondary | ICD-10-CM | POA: Diagnosis not present

## 2018-08-02 DIAGNOSIS — E785 Hyperlipidemia, unspecified: Secondary | ICD-10-CM | POA: Diagnosis not present

## 2018-08-02 DIAGNOSIS — I10 Essential (primary) hypertension: Secondary | ICD-10-CM | POA: Diagnosis not present

## 2018-11-02 DIAGNOSIS — Z23 Encounter for immunization: Secondary | ICD-10-CM | POA: Diagnosis not present

## 2018-11-15 DIAGNOSIS — Z1231 Encounter for screening mammogram for malignant neoplasm of breast: Secondary | ICD-10-CM | POA: Diagnosis not present

## 2019-03-10 DIAGNOSIS — R062 Wheezing: Secondary | ICD-10-CM | POA: Diagnosis not present

## 2019-03-10 DIAGNOSIS — R05 Cough: Secondary | ICD-10-CM | POA: Diagnosis not present

## 2019-06-14 DIAGNOSIS — M1712 Unilateral primary osteoarthritis, left knee: Secondary | ICD-10-CM | POA: Diagnosis not present

## 2019-07-10 DIAGNOSIS — Z0000 Encounter for general adult medical examination without abnormal findings: Secondary | ICD-10-CM | POA: Diagnosis not present

## 2019-07-10 DIAGNOSIS — Z6834 Body mass index (BMI) 34.0-34.9, adult: Secondary | ICD-10-CM | POA: Diagnosis not present

## 2019-07-10 DIAGNOSIS — K582 Mixed irritable bowel syndrome: Secondary | ICD-10-CM | POA: Diagnosis not present

## 2019-07-10 DIAGNOSIS — E7800 Pure hypercholesterolemia, unspecified: Secondary | ICD-10-CM | POA: Diagnosis not present

## 2019-07-10 DIAGNOSIS — I10 Essential (primary) hypertension: Secondary | ICD-10-CM | POA: Diagnosis not present

## 2019-07-10 DIAGNOSIS — F419 Anxiety disorder, unspecified: Secondary | ICD-10-CM | POA: Diagnosis not present

## 2019-07-10 DIAGNOSIS — K219 Gastro-esophageal reflux disease without esophagitis: Secondary | ICD-10-CM | POA: Diagnosis not present

## 2019-07-10 DIAGNOSIS — M2550 Pain in unspecified joint: Secondary | ICD-10-CM | POA: Diagnosis not present

## 2019-07-10 DIAGNOSIS — F329 Major depressive disorder, single episode, unspecified: Secondary | ICD-10-CM | POA: Diagnosis not present

## 2019-07-13 DIAGNOSIS — Z79899 Other long term (current) drug therapy: Secondary | ICD-10-CM | POA: Diagnosis not present

## 2019-07-13 DIAGNOSIS — E7800 Pure hypercholesterolemia, unspecified: Secondary | ICD-10-CM | POA: Diagnosis not present

## 2019-07-24 ENCOUNTER — Encounter: Payer: Self-pay | Admitting: Gastroenterology

## 2019-08-23 ENCOUNTER — Ambulatory Visit: Payer: Commercial Managed Care - HMO | Admitting: Gastroenterology

## 2019-08-23 ENCOUNTER — Encounter: Payer: Self-pay | Admitting: Gastroenterology

## 2019-08-23 ENCOUNTER — Telehealth: Payer: Self-pay

## 2019-08-23 VITALS — HR 83 | Ht 65.0 in | Wt 191.0 lb

## 2019-08-23 DIAGNOSIS — R1032 Left lower quadrant pain: Secondary | ICD-10-CM

## 2019-08-23 DIAGNOSIS — K219 Gastro-esophageal reflux disease without esophagitis: Secondary | ICD-10-CM | POA: Diagnosis not present

## 2019-08-23 DIAGNOSIS — Z01818 Encounter for other preprocedural examination: Secondary | ICD-10-CM

## 2019-08-23 DIAGNOSIS — R10829 Rebound abdominal tenderness, unspecified site: Secondary | ICD-10-CM | POA: Diagnosis not present

## 2019-08-23 DIAGNOSIS — R159 Full incontinence of feces: Secondary | ICD-10-CM

## 2019-08-23 MED ORDER — NA SULFATE-K SULFATE-MG SULF 17.5-3.13-1.6 GM/177ML PO SOLN
ORAL | 0 refills | Status: DC
Start: 2019-08-23 — End: 2019-09-26

## 2019-08-23 NOTE — Patient Instructions (Addendum)
If you are age 75 or older, your body mass index should be between 23-30. Your Body mass index is 31.78 kg/m. If this is out of the aforementioned range listed, please consider follow up with your Primary Care Provider.  If you are age 29 or younger, your body mass index should be between 19-25. Your Body mass index is 31.78 kg/m. If this is out of the aformentioned range listed, please consider follow up with your Primary Care Provider.   You have been scheduled for an endoscopy and colonoscopy. Please follow the written instructions given to you at your visit today. Please pick up your prep supplies at the pharmacy within the next 1-3 days. If you use inhalers (even only as needed), please bring them with you on the day of your procedure.  You have been scheduled for a CT scan of the abdomen and pelvis at [facility], [place], [place]  You are scheduled on  at . You should arrive 15 minutes prior to your appointment time for registration. Please follow the written instructions below on the day of your exam:  WARNING: IF YOU ARE ALLERGIC TO IODINE/X-RAY DYE, PLEASE NOTIFY RADIOLOGY IMMEDIATELY AT [phone]! YOU WILL BE GIVEN A 13 HOUR PREMEDICATION PREP.  1) Do not eat or drink anything after  (4 hours prior to your test) 2) You have been given 2 bottles of oral contrast to drink. The solution may taste better if refrigerated, but do NOT add ice or any other liquid to this solution. Shake well before drinking.    Drink 1 bottle of contrast @  (2 hours prior to your exam)  Drink 1 bottle of contrast @  (1 hour prior to your exam)  You may take any medications as prescribed with a small amount of water, if necessary. If you take any of the following medications: METFORMIN, GLUCOPHAGE, GLUCOVANCE, AVANDAMET, RIOMET, FORTAMET, [place] MET, JANUMET, GLUMETZA or METAGLIP, you MAY be asked to HOLD this medication 48 hours AFTER the exam.  The purpose of you drinking  the oral contrast is to aid in the visualization of your intestinal tract. The contrast solution may cause some diarrhea. Depending on your individual set of symptoms, you may also receive an intravenous injection of x-ray contrast/dye. Plan on being at [facility] for 30 minutes or longer, depending on the type of exam you are having performed.  This test typically takes 30-45 minutes to complete.  If you have any questions regarding your exam or if you need to reschedule, you may call the CT department at [phone] between the hours of 8:00 am and 5:00 pm, Monday-Friday.  __________________________________________________________  Please have labs drawn today.  CBC CMP Celiac panel  Continue Nexium.  STOP Fish Oil.  Check Calcium to see if any magnesium is in it.  You have been given Kegel exercise.  We are requesting labs from Dr Humphrey Rolls.  Thank you,  Dr. Jackquline Denmark   Kegel Exercises   Kegel exercises can help strengthen your pelvic floor muscles. The pelvic floor is a group of muscles that support your rectum, small intestine, and bladder. In females, pelvic floor muscles also help support the womb (uterus). These muscles help you control the flow of urine and stool. Kegel exercises are painless and simple, and they do not require any equipment. Your provider may suggest Kegel exercises to:  Improve bladder and bowel control.  Improve sexual response.  Improve weak pelvic floor muscles after surgery to remove  the uterus (hysterectomy) or pregnancy (females).  Improve weak pelvic floor muscles after prostate gland removal or surgery (males). Kegel exercises involve squeezing your pelvic floor muscles, which are the same muscles you squeeze when you try to stop the flow of urine or keep from passing gas. The exercises can be done while sitting, standing, or lying down, but it is best to vary your position. Exercises How to do Kegel exercises: 1. Squeeze your  pelvic floor muscles tight. You should feel a tight lift in your rectal area. If you are a female, you should also feel a tightness in your vaginal area. Keep your stomach, buttocks, and legs relaxed. 2. Hold the muscles tight for up to 10 seconds. 3. Breathe normally. 4. Relax your muscles. 5. Repeat as told by your health care provider. Repeat this exercise daily as told by your health care provider. Continue to do this exercise for at least 4-6 weeks, or for as long as told by your health care provider. You may be referred to a physical therapist who can help you learn more about how to do Kegel exercises. Depending on your condition, your health care provider may recommend:  Varying how long you squeeze your muscles.  Doing several sets of exercises every day.  Doing exercises for several weeks.  Making Kegel exercises a part of your regular exercise routine. This information is not intended to replace advice given to you by your health care provider. Make sure you discuss any questions you have with your health care provider. Document Revised: 09/08/2017 Document Reviewed: 09/08/2017 Elsevier Patient Education  [place].

## 2019-08-23 NOTE — Telephone Encounter (Signed)
Spoke with patient this afternoon regarding appointment for CT scan at [facility] for 08/31/19 at 2:15 pm. Pt given instructions

## 2019-08-23 NOTE — Progress Notes (Signed)
Chief Complaint: For GI evaluation  Referring Provider:  Mateo Flow, MD      ASSESSMENT AND PLAN;   #1.  LLQ pain/tenderness   #2. GERD with eso dysphagia  #3.  Alternating diarrhea/constipation with episodic Fecal incontinence.  Good rectal tone.   Plan: - CT AP with PO and IV contrast - EGD with dil/colon.  I discussed risks and benefits with the patient and patient's daughter.  Consent forms were given. - Nexium 20 mg p.o. once a day to continue for now. - Stop fish oil x 2 weeks - Kegel's exercises. - FU therafter - Blood tests from Dr Humphrey Rolls - Daughter to check if there is any Mg in Ca supplements - CBC, CMP, celiac today. - Minimize nonsteroidals including Mobic.   HPI:    Samantha Schwartz is a 74 y.o. female  Accompanied by her daughter  -Dysphagia x 1 yr, like breads, meats.  Getting somewhat worse.  Mostly gets stuck in the mid chest.  She has longstanding history of reflux and has been on Nexium.  No regurgitation.  Advised EGD.  -Has alternating diarrhea and constipation x several years but more prominently over the last 35yrs. did not want colonoscopy previously.  Has been offered several times.  Had Cologuard 5 years ago which was negative.  No melena or hematochezia.  Does have abdominal bloating and lower abdominal pain which gets better with defecation.  No fever chills or night sweats.  -Has been having episodes of profuse diarrhea at times with incontinence and soiling. "  Attacks happen so fast that she is not able to make it to the restroom"  No weight loss or loss of appetite.  No history suggestive of any food intolerance.  No increased problems with milk or cheese.  No sodas, chocolates, chewing gums, artificial sweeteners and candy.  She does take Mobic for back pain.  Per daughter-her TSH was normal few months ago.   Past Medical History:  Diagnosis Date  . Arthritis   . GERD (gastroesophageal reflux disease)   . Headache   .  Hypertension   . Obesity   . Primary localized osteoarthritis of right knee 11/13/2014  . Shortness of breath dyspnea     Past Surgical History:  Procedure Laterality Date  .  arm surgery Right   . APPENDECTOMY    . CHOLECYSTECTOMY    . HERNIA REPAIR    . KNEE ARTHROSCOPY Right   . TOTAL KNEE ARTHROPLASTY Right 11/13/2014   Procedure: TOTAL KNEE ARTHROPLASTY;  Surgeon: Marchia Bond, MD;  Location: [facility];  Service: Orthopedics;  Laterality: Right;  . TUBAL LIGATION      Family History  Problem Relation Age of Onset  . Diabetes Mellitus II Mother   . Heart disease Mother   . Heart disease Father   . Colon cancer Neg Hx   . Esophageal cancer Neg Hx     Social History   Tobacco Use  . Smoking status: Never Smoker  . Smokeless tobacco: Never Used  Substance Use Topics  . Alcohol use: No  . Drug use: No    Current Outpatient Medications  Medication Sig Dispense Refill  . aspirin EC 81 MG tablet Take 81 mg by mouth daily. Swallow whole.    . Calcium Carbonate-Vitamin D (CALCIUM 600+D PO) Take 1 tablet by mouth 2 (two) times daily. Calcium 600mg  w/Vit D 20mg     . Cranberry-Vitamin C-Vitamin E (CRANBERRY PLUS VITAMIN C) 4200-20-3 MG-MG-UNIT CAPS Take  1 tablet by mouth 2 (two) times daily.    . DULoxetine (CYMBALTA) 60 MG capsule 1 capsule daily.    Marland Kitchen esomeprazole (NEXIUM) 20 MG capsule Take 20 mg by mouth daily at 12 noon.    . meloxicam (MOBIC) 15 MG tablet Take 15 mg by mouth daily.    . Multiple Vitamins-Minerals (MULTIVITAMIN WITH MINERALS) tablet Take 1 tablet by mouth daily.    Marland Kitchen omega-3 fish oil (MAXEPA) 1000 MG CAPS capsule Take 1 capsule by mouth daily.     . Potassium 99 MG TABS Take 1 tablet by mouth 2 (two) times daily.     Marland Kitchen pyridoxine (B-6) 100 MG tablet Take 100 mg by mouth daily.    . valsartan-hydrochlorothiazide (DIOVAN-HCT) 160-12.5 MG tablet Take 1 tablet by mouth daily.    . vitamin B-12 (CYANOCOBALAMIN) 500 MCG tablet Take 500 mcg by mouth daily.      No current facility-administered medications for this visit.    Allergies  Allergen Reactions  . Celebrex [Celecoxib] Rash  . Codeine     Itching. Doesn't make patient feel good  . Sulfur Rash    Review of Systems:  Constitutional: Denies fever, chills, diaphoresis, appetite change and fatigue.  HEENT: Denies photophobia, eye pain, redness, hearing loss, ear pain, congestion, sore throat, rhinorrhea, sneezing, mouth sores, neck pain, neck stiffness and tinnitus.   Respiratory: Denies SOB, DOE, cough, chest tightness,  and wheezing.   Cardiovascular: Denies chest pain, palpitations and leg swelling.  Genitourinary: Denies dysuria, urgency, frequency, hematuria, flank pain and difficulty urinating.  Musculoskeletal: has myalgias, back pain, joint swelling, arthralgias and gait problem.  Skin: No rash.  Neurological: Denies dizziness, seizures, syncope, weakness, light-headedness, numbness and headaches.  Hematological: Denies adenopathy. Easy bruising, personal or family bleeding history  Psychiatric/Behavioral: No anxiety or depression     Physical Exam:    BP 140/84   Pulse 83   Ht 5\' 5"  (1.651 m)   Wt 191 lb (86.6 kg)   BMI 31.78 kg/m  Wt Readings from Last 3 Encounters:  08/23/19 191 lb (86.6 kg)  11/13/14 198 lb (89.8 kg)  11/05/14 198 lb 1.6 oz (89.9 kg)   Constitutional:  Well-developed, in no acute distress. Psychiatric: Normal mood and affect. Behavior is normal. HEENT: Pupils normal.  Conjunctivae are normal. No scleral icterus. Neck supple.  Cardiovascular: Normal rate, regular rhythm. No edema Pulmonary/chest: Effort normal and breath sounds normal. No wheezing, rales or rhonchi. Abdominal: Soft, nondistended.  Tenderness in left lower quadrant.  No rebound.. Bowel sounds active throughout. There are no masses palpable. No hepatomegaly. Rectal: Examined in presence of [place].  Small external hemorrhoids.  Good rectal tone.  Brown heme-negative  stools. Neurological: Alert and oriented to person place and time. Skin: Skin is warm and dry. No rashes noted.  Data Reviewed: I have personally reviewed following labs and imaging studies  CBC: CBC Latest Ref Rng & Units 11/15/2014 11/14/2014 11/05/2014  WBC 4.0 - 10.5 K/uL 11.4(H) 9.2 6.9  Hemoglobin 12.0 - 15.0 g/dL 9.9(L) 10.1(L) 12.4  Hematocrit 36 - 46 % 30.5(L) 30.4(L) 37.8  Platelets 150 - 400 K/uL 299 300 372    CMP: CMP Latest Ref Rng & Units 11/14/2014 11/05/2014  Glucose 65 - 99 mg/dL 144(H) 107(H)  BUN 6 - 20 mg/dL 14 12  Creatinine 0.44 - 1.00 mg/dL 0.69 0.88  Sodium 135 - 145 mmol/L 132(L) 135  Potassium 3.5 - 5.1 mmol/L 3.9 4.0  Chloride 101 - 111 mmol/L 95(L) 101  CO2 22 - 32 mmol/L 26 25  Calcium 8.9 - 10.3 mg/dL 8.9 9.5      Carmell Austria, MD 08/23/2019, 8:53 AM  Cc: Mateo Flow, MD

## 2019-08-24 LAB — COMPREHENSIVE METABOLIC PANEL
ALT: 11 IU/L (ref 0–32)
AST: 18 IU/L (ref 0–40)
Albumin/Globulin Ratio: 1.8 (ref 1.2–2.2)
Albumin: 4.4 g/dL (ref 3.7–4.7)
Alkaline Phosphatase: 106 IU/L (ref 48–121)
BUN/Creatinine Ratio: 17 (ref 12–28)
BUN: 14 mg/dL (ref 8–27)
Bilirubin Total: <0.2 mg/dL (ref 0.0–1.2)
CO2: 21 mmol/L (ref 20–29)
Calcium: 9.6 mg/dL (ref 8.7–10.3)
Chloride: 98 mmol/L (ref 96–106)
Creatinine, Ser: 0.83 mg/dL (ref 0.57–1.00)
GFR calc Af Amer: 80 mL/min/{1.73_m2} (ref >59)
GFR calc non Af Amer: 70 mL/min/{1.73_m2} (ref >59)
Globulin, Total: 2.4 g/dL (ref 1.5–4.5)
Glucose: 100 mg/dL — ABNORMAL HIGH (ref 65–99)
Potassium: 4.2 mmol/L (ref 3.5–5.2)
Sodium: 136 mmol/L (ref 134–144)
Total Protein: 6.8 g/dL (ref 6.0–8.5)

## 2019-08-24 LAB — SPECIMEN STATUS REPORT

## 2019-08-25 LAB — CELIAC PANEL 10
Antigliadin Abs, IgA: 3 units (ref 0–19)
Endomysial IgA: NEGATIVE
Gliadin IgG: 1 units (ref 0–19)
IgA/Immunoglobulin A, Serum: 149 mg/dL (ref 64–422)
Tissue Transglut Ab: <2 U/mL (ref 0–5)
Transglutaminase IgA: <2 U/mL (ref 0–3)

## 2019-08-30 ENCOUNTER — Telehealth: Payer: Self-pay | Admitting: Gastroenterology

## 2019-08-30 NOTE — Telephone Encounter (Signed)
Pt's daughter [name] is requesting a call back from [place] to discuss the labs and CT scan the pt has scheduled for tomorrow

## 2019-08-30 NOTE — Telephone Encounter (Signed)
Spoke with patients daughter about CBC being drawn before or after CT scan. Okay to draw before or after at [facility], [place], [place]

## 2019-08-31 ENCOUNTER — Other Ambulatory Visit: Payer: Self-pay

## 2019-08-31 ENCOUNTER — Ambulatory Visit (HOSPITAL_COMMUNITY)
Admission: RE | Admit: 2019-08-31 | Discharge: 2019-08-31 | Disposition: A | Discharge status: Home / Self Care | Payer: Medicare HMO | Source: Ambulatory Visit | Attending: Gastroenterology | Admitting: Gastroenterology

## 2019-08-31 DIAGNOSIS — K5730 Diverticulosis of large intestine without perforation or abscess without bleeding: Secondary | ICD-10-CM | POA: Diagnosis not present

## 2019-08-31 DIAGNOSIS — R1032 Left lower quadrant pain: Secondary | ICD-10-CM

## 2019-08-31 MED ORDER — IOHEXOL 300 MG/ML  SOLN
100.0000 mL | Freq: Once | INTRAMUSCULAR | Status: AC | PRN
  Administered 2019-08-31: 100 mL via INTRAVENOUS

## 2019-08-31 MED ORDER — SODIUM CHLORIDE (PF) 0.9 % IJ SOLN
INTRAMUSCULAR | Status: AC
  Filled 2019-08-31: qty 50

## 2019-09-18 ENCOUNTER — Encounter: Payer: Self-pay | Admitting: Gastroenterology

## 2019-09-22 ENCOUNTER — Other Ambulatory Visit (INDEPENDENT_AMBULATORY_CARE_PROVIDER_SITE_OTHER): Payer: Medicare HMO

## 2019-09-22 ENCOUNTER — Other Ambulatory Visit: Payer: Self-pay | Admitting: Gastroenterology

## 2019-09-22 ENCOUNTER — Ambulatory Visit (INDEPENDENT_AMBULATORY_CARE_PROVIDER_SITE_OTHER): Payer: Medicare HMO

## 2019-09-22 ENCOUNTER — Other Ambulatory Visit: Payer: Self-pay

## 2019-09-22 DIAGNOSIS — Z1159 Encounter for screening for other viral diseases: Secondary | ICD-10-CM

## 2019-09-22 DIAGNOSIS — R1032 Left lower quadrant pain: Secondary | ICD-10-CM

## 2019-09-22 LAB — CBC WITH DIFFERENTIAL/PLATELET
Basophils Absolute: 0.1 10*3/uL (ref 0.0–0.1)
Basophils Relative: 1 % (ref 0.0–3.0)
Eosinophils Absolute: 0.2 10*3/uL (ref 0.0–0.7)
Eosinophils Relative: 2.2 % (ref 0.0–5.0)
HCT: 38.6 % (ref 36.0–46.0)
Hemoglobin: 12.9 g/dL (ref 12.0–15.0)
Lymphocytes Relative: 27.8 % (ref 12.0–46.0)
Lymphs Abs: 2 10*3/uL (ref 0.7–4.0)
MCHC: 33.5 g/dL (ref 30.0–36.0)
MCV: 90 fl (ref 78.0–100.0)
Monocytes Absolute: 0.6 10*3/uL (ref 0.1–1.0)
Monocytes Relative: 8.4 % (ref 3.0–12.0)
Neutro Abs: 4.4 10*3/uL (ref 1.4–7.7)
Neutrophils Relative %: 60.6 % (ref 43.0–77.0)
Platelets: 391 10*3/uL (ref 150.0–400.0)
RBC: 4.29 Mil/uL (ref 3.87–5.11)
RDW: 12.5 % (ref 11.5–15.5)
WBC: 7.3 10*3/uL (ref 4.0–10.5)

## 2019-09-22 LAB — SARS CORONAVIRUS 2 (TAT 6-24 HRS): SARS Coronavirus 2: NEGATIVE

## 2019-09-26 ENCOUNTER — Encounter: Payer: Self-pay | Admitting: Gastroenterology

## 2019-09-26 ENCOUNTER — Other Ambulatory Visit: Payer: Self-pay

## 2019-09-26 ENCOUNTER — Ambulatory Visit (AMBULATORY_SURGERY_CENTER): Payer: Medicare HMO | Admitting: Gastroenterology

## 2019-09-26 VITALS — BP 152/66 | HR 83 | Temp 97.1°F | Resp 17 | Ht 65.0 in | Wt 191.0 lb

## 2019-09-26 DIAGNOSIS — R159 Full incontinence of feces: Secondary | ICD-10-CM

## 2019-09-26 DIAGNOSIS — R1310 Dysphagia, unspecified: Secondary | ICD-10-CM

## 2019-09-26 DIAGNOSIS — K219 Gastro-esophageal reflux disease without esophagitis: Secondary | ICD-10-CM

## 2019-09-26 DIAGNOSIS — K5730 Diverticulosis of large intestine without perforation or abscess without bleeding: Secondary | ICD-10-CM | POA: Diagnosis not present

## 2019-09-26 DIAGNOSIS — K649 Unspecified hemorrhoids: Secondary | ICD-10-CM

## 2019-09-26 DIAGNOSIS — K2960 Other gastritis without bleeding: Secondary | ICD-10-CM

## 2019-09-26 DIAGNOSIS — K3189 Other diseases of stomach and duodenum: Secondary | ICD-10-CM | POA: Diagnosis not present

## 2019-09-26 DIAGNOSIS — K449 Diaphragmatic hernia without obstruction or gangrene: Secondary | ICD-10-CM | POA: Diagnosis not present

## 2019-09-26 DIAGNOSIS — K319 Disease of stomach and duodenum, unspecified: Secondary | ICD-10-CM

## 2019-09-26 DIAGNOSIS — D123 Benign neoplasm of transverse colon: Secondary | ICD-10-CM | POA: Diagnosis not present

## 2019-09-26 DIAGNOSIS — K529 Noninfective gastroenteritis and colitis, unspecified: Secondary | ICD-10-CM | POA: Diagnosis not present

## 2019-09-26 DIAGNOSIS — K222 Esophageal obstruction: Secondary | ICD-10-CM | POA: Diagnosis not present

## 2019-09-26 MED ORDER — PANTOPRAZOLE SODIUM 40 MG PO TBEC: 40.0000 mg | DELAYED_RELEASE_TABLET | Freq: Every day | ORAL | 11 refills | Status: AC

## 2019-09-26 MED ORDER — SODIUM CHLORIDE 0.9 % IV SOLN: 500.0000 mL | Freq: Once | INTRAVENOUS | Status: DC

## 2019-09-26 NOTE — Progress Notes (Signed)
pt tolerated well. VSS. awake and to recovery. Report given to RN. Bite block inserted while awake no complaints. Bite block removed with ease. No trauma.

## 2019-09-26 NOTE — Progress Notes (Signed)
Called to room to assist during endoscopic procedure.  Patient ID and intended procedure confirmed with present staff. Received instructions for my participation in the procedure from the performing physician.  

## 2019-09-26 NOTE — Patient Instructions (Addendum)
UPPER ENDOSCOPY:  Follow dilatation diet -HANDOUT given to you today- nothing to eat or drink until 5:15 pm,                                                                    Then clear liquids for one hour 5:15- 6:15                                                                    Then soft foods the rest of today   Change Protonix to Nexium - New order sent to your pharmacy   AVOID IBUPROFEN,NAPROXEN,OR OTHER NON STEROIDAL ANTI INFLAMMATORY MEDICATIONS  HANDOUTS ON GASTRITIS AND ESOPHAGEAL REFLUX & HIATAL HERNIA GIVEN TO YOU TODAY   COLONOSCOPY:     AWAIT PATHOLOGY RESULTS ON BIOPSIES DONE AND ON POLYPS REMOVED                                       HANDOUTS ON POLYPS ,DIVERTICULOSIS ,& HEMORRHOIDS GIVEN TO YOU TODAY                                      STOP ANY HERBAL SUPPLEMENTS ESPECIALLY IF THEY HAVE SENNA                                        RETURN TO CLINIC TO SEE DR GUPTA IN 12 WEEKS    YOU HAD AN ENDOSCOPIC PROCEDURE TODAY AT [facility]:   Refer to the procedure report that was given to you for any specific questions about what was found during the examination.  If the procedure report does not answer your questions, please call your gastroenterologist to clarify.  If you requested that your care partner not be given the details of your procedure findings, then the procedure report has been included in a sealed envelope for you to review at your convenience later.  YOU SHOULD EXPECT: Some feelings of bloating in the abdomen. Passage of more gas than usual.  Walking can help get rid of the air that was put into your GI tract during the procedure and reduce the bloating. If you had a lower endoscopy (such as a colonoscopy or flexible sigmoidoscopy) you may notice spotting of blood in your stool or on the toilet paper. If you underwent a bowel prep for your procedure, you may not have a normal bowel movement for a few days.  Please Note:  You might notice  some irritation and congestion in your nose or some drainage.  This is from the oxygen used during your procedure.  There is no need for concern and it should clear up in a day or so.  SYMPTOMS TO REPORT IMMEDIATELY:   Following lower endoscopy (colonoscopy or  flexible sigmoidoscopy):  Excessive amounts of blood in the stool  Significant tenderness or worsening of abdominal pains  Swelling of the abdomen that is new, acute  Fever of 100F or higher   Following upper endoscopy (EGD)  Vomiting of blood or coffee ground material  New chest pain or pain under the shoulder blades  Painful or persistently difficult swallowing  New shortness of breath  Fever of 100F or higher  Black, tarry-looking stools  For urgent or emergent issues, a gastroenterologist can be reached at any hour by calling [phone]. Do not use MyChart messaging for urgent concerns.    DIET:  We do recommend a small meal at first, but then you may proceed to your regular diet.  Drink plenty of fluids but you should avoid alcoholic beverages for 24 hours.  ACTIVITY:  You should plan to take it easy for the rest of today and you should NOT DRIVE or use heavy machinery until tomorrow (because of the sedation medicines used during the test).    FOLLOW UP: Our staff will call the number listed on your records 48-72 hours following your procedure to check on you and address any questions or concerns that you may have regarding the information given to you following your procedure. If we do not reach you, we will leave a message.  We will attempt to reach you two times.  During this call, we will ask if you have developed any symptoms of COVID 19. If you develop any symptoms (ie: fever, flu-like symptoms, shortness of breath, cough etc.) before then, please call [phone].  If you test positive for Covid 19 in the 2 weeks post procedure, please call and report this information to [place].    If any biopsies were taken you  will be contacted by phone or by letter within the next 1-3 weeks.  Please call us at [phone] if you have not heard about the biopsies in 3 weeks.    SIGNATURES/CONFIDENTIALITY: You and/or your care partner have signed paperwork which will be entered into your electronic medical record.  These signatures attest to the fact that that the information above on your After Visit Summary has been reviewed and is understood.  Full responsibility of the confidentiality of this discharge information lies with you and/or your care-partner.

## 2019-09-26 NOTE — Progress Notes (Signed)
Pt's states no medical or surgical changes since previsit or office visit. 

## 2019-09-26 NOTE — Op Note (Signed)
[facility] Patient Name: Samantha Schwartz Procedure Date: 09/26/2019 3:30 PM MRN: [id number] Endoscopist: Jackquline Denmark , MD Age: 74 Referring MD:  Date of Birth: 08-14-1945 Gender: Female Account #: [id number] Procedure:                Upper GI endoscopy Indications:              Dysphagia, GERD Medicines:                Monitored Anesthesia Care Procedure:                Pre-Anesthesia Assessment:                           - Prior to the procedure, a History and Physical                            was performed, and patient medications and                            allergies were reviewed. The patient's tolerance of                            previous anesthesia was also reviewed. The risks                            and benefits of the procedure and the sedation                            options and risks were discussed with the patient.                            All questions were answered, and informed consent                            was obtained. Prior Anticoagulants: The patient has                            taken no previous anticoagulant or antiplatelet                            agents. ASA Grade Assessment: II - A patient with                            mild systemic disease. After reviewing the risks                            and benefits, the patient was deemed in                            satisfactory condition to undergo the procedure.                           After obtaining informed consent, the endoscope was  passed under direct vision. Throughout the                            procedure, the patient's blood pressure, pulse, and                            oxygen saturations were monitored continuously. The                            Endoscope was introduced through the mouth, and                            advanced to the second part of duodenum. The upper                            GI endoscopy was accomplished without  difficulty.                            The patient tolerated the procedure well. Scope In: Scope Out: Findings:                 A moderate Schatzki ring was found at the                            gastroesophageal junction. The scope was withdrawn.                            Dilation was performed with a Maloney dilator with                            mild resistance at 50 Fr.                           A 2 cm hiatal hernia was present.                           Localized mild inflammation characterized by                            erosions and erythema was found in the gastric                            antrum. Biopsies were taken with a cold forceps for                            histology.                           The examined duodenum was normal. Biopsies for                            histology were taken with a cold forceps for                            evaluation of celiac  disease. Complications:            No immediate complications. Estimated Blood Loss:     Estimated blood loss: none. Impression:               - Moderate Schatzki ring. Dilated.                           - 2 cm hiatal hernia.                           - Gastritis. Biopsied. Recommendation:           - Patient has a contact number available for                            emergencies. The signs and symptoms of potential                            delayed complications were discussed with the                            patient. Return to normal activities tomorrow.                            Written discharge instructions were provided to the                            patient.                           - Post dilatation diet.                           - Change Nexium to Protonix 40 mg p.o. once a day,                            #30, 11 refills.                           - Await pathology results.                           - Avoid ibuprofen, naproxen, or other non-steroidal                             anti-inflammatory drugs.                           - The findings and recommendations were discussed                            with the patient's daughter Samantha Schwartz. Jackquline Denmark, MD 09/26/2019 4:10:34 PM This report has been signed electronically.

## 2019-09-26 NOTE — Op Note (Signed)
[facility] Patient Name: Samantha Schwartz Procedure Date: 09/26/2019 3:30 PM MRN: [id number] Endoscopist: Jackquline Denmark , MD Age: 74 Referring MD:  Date of Birth: [birth year] Gender: Female Account #: [id number] Procedure:                Colonoscopy Indications:              Clinically significant diarrhea of unexplained                            origin Medicines:                Monitored Anesthesia Care Procedure:                Pre-Anesthesia Assessment:                           - Prior to the procedure, a History and Physical                            was performed, and patient medications and                            allergies were reviewed. The patient's tolerance of                            previous anesthesia was also reviewed. The risks                            and benefits of the procedure and the sedation                            options and risks were discussed with the patient.                            All questions were answered, and informed consent                            was obtained. Prior Anticoagulants: The patient has                            taken no previous anticoagulant or antiplatelet                            agents. ASA Grade Assessment: II - A patient with                            mild systemic disease. After reviewing the risks                            and benefits, the patient was deemed in                            satisfactory condition to undergo the procedure.  After obtaining informed consent, the colonoscope                            was passed under direct vision. Throughout the                            procedure, the patient's blood pressure, pulse, and                            oxygen saturations were monitored continuously. The                            Colonoscope was introduced through the anus and                            advanced to the the cecum, identified by                             appendiceal orifice and ileocecal valve. Terminal                            ileum could not be intubated d/t formation of loops                            and anatomy. The colonoscopy was somewhat difficult                            due to a redundant colon. Successful completion of                            the procedure was aided by applying abdominal                            pressure. The patient tolerated the procedure well.                            The quality of the bowel preparation was good. The                            ileocecal valve, appendiceal orifice, and rectum                            were photographed. Scope In: 3:44:27 PM Scope Out: 4:02:02 PM Scope Withdrawal Time: 0 hours 11 minutes 6 seconds  Total Procedure Duration: 0 hours 17 minutes 35 seconds  Findings:                 The colon (entire examined portion) appeared normal                            except for mild melanosis coli. Biopsies for                            histology were taken with a cold forceps  from the                            entire colon for evaluation of microscopic colitis.                           Two sessile polyps were found in the proximal                            transverse colon and mid transverse colon. The                            polyps were 4 to 6 mm in size. Smaller polyp was                            removed with cold biopsy forceps and the larger one                            with a cold snare. Resection and retrieval were                            complete.                           Multiple medium-mouthed diverticula were found in                            the sigmoid colon.                           Non-bleeding internal hemorrhoids were found during                            retroflexion. The hemorrhoids were small.                           The exam was otherwise without abnormality on                            direct and retroflexion  views. Complications:            No immediate complications. Estimated Blood Loss:     Estimated blood loss: none. Impression:               -Small colonic polyps s/p polypectomy.                           -Moderate sigmoid diverticulosis.                           -Non-bleeding internal hemorrhoids.                           -The examination was otherwise normal on direct and                            retroflexion views. Recommendation:           -  Patient has a contact number available for                            emergencies. The signs and symptoms of potential                            delayed complications were discussed with the                            patient. Return to normal activities tomorrow.                            Written discharge instructions were provided to the                            patient.                           - Resume previous diet.                           - Continue present medications.                           - Await pathology results.                           - Stop any herbal supplements especially if                            supplements have senna.                           - Return to GI clinic in 12 weeks. Jackquline Denmark, MD 09/26/2019 4:25:21 PM This report has been signed electronically.

## 2019-09-28 ENCOUNTER — Telehealth: Payer: Self-pay

## 2019-09-28 NOTE — Telephone Encounter (Signed)
  Follow up Call-  Call back number 09/26/2019  Post procedure Call Back phone  # [phone]  Permission to leave phone message Yes  Some recent data might be hidden     Patient questions:  Do you have a fever, pain , or abdominal swelling? No. Pain Score  0 *  Have you tolerated food without any problems? Yes.    Have you been able to return to your normal activities? Yes.    Do you have any questions about your discharge instructions: Diet   No. Medications  No. Follow up visit  No.  Do you have questions or concerns about your Care? No.  Actions: * If pain score is 4 or above: No action needed, pain <4.   1. Have you developed a fever since your procedure? No   2.   Have you had an respiratory symptoms (SOB or cough) since your procedure? No   3.   Have you tested positive for COVID 19 since your procedure? No   4.   Have you had any family members/close contacts diagnosed with the COVID 19 since your procedure?  No    If yes to any of these questions please route to Joylene John, RN and Joella Prince, RN

## 2019-10-01 ENCOUNTER — Encounter: Payer: Self-pay | Admitting: Gastroenterology

## 2019-11-16 DIAGNOSIS — H25013 Cortical age-related cataract, bilateral: Secondary | ICD-10-CM | POA: Diagnosis not present

## 2019-11-16 DIAGNOSIS — H5203 Hypermetropia, bilateral: Secondary | ICD-10-CM | POA: Diagnosis not present

## 2019-11-16 DIAGNOSIS — H40013 Open angle with borderline findings, low risk, bilateral: Secondary | ICD-10-CM | POA: Diagnosis not present

## 2019-11-16 DIAGNOSIS — H348322 Tributary (branch) retinal vein occlusion, left eye, stable: Secondary | ICD-10-CM | POA: Diagnosis not present

## 2020-01-02 DIAGNOSIS — Z23 Encounter for immunization: Secondary | ICD-10-CM | POA: Diagnosis not present

## 2020-01-04 ENCOUNTER — Ambulatory Visit: Payer: Medicare HMO | Admitting: Gastroenterology

## 2020-01-04 ENCOUNTER — Encounter: Payer: Self-pay | Admitting: Gastroenterology

## 2020-01-04 VITALS — BP 122/74 | HR 80 | Ht 65.0 in | Wt 188.1 lb

## 2020-01-04 DIAGNOSIS — K449 Diaphragmatic hernia without obstruction or gangrene: Secondary | ICD-10-CM | POA: Diagnosis not present

## 2020-01-04 DIAGNOSIS — R159 Full incontinence of feces: Secondary | ICD-10-CM

## 2020-01-04 DIAGNOSIS — K219 Gastro-esophageal reflux disease without esophagitis: Secondary | ICD-10-CM | POA: Diagnosis not present

## 2020-01-04 MED ORDER — CHOLESTYRAMINE 4 G PO PACK: 4.0000 g | PACK | Freq: Every day | ORAL | 12 refills | Status: AC

## 2020-01-04 NOTE — Patient Instructions (Signed)
If you are age 75 or older, your body mass index should be between 23-30. Your Body mass index is 31.31 kg/m. If this is out of the aforementioned range listed, please consider follow up with your Primary Care Provider.  If you are age 65 or younger, your body mass index should be between 19-25. Your Body mass index is 31.31 kg/m. If this is out of the aformentioned range listed, please consider follow up with your Primary Care Provider.   We have sent the following medications to your pharmacy for you to pick up at your convenience: Cholestyramine    Kegel Exercises  Kegel exercises can help strengthen your pelvic floor muscles. The pelvic floor is a group of muscles that support your rectum, small intestine, and bladder. In females, pelvic floor muscles also help support the womb (uterus). These muscles help you control the flow of urine and stool. Kegel exercises are painless and simple, and they do not require any equipment. Your provider may suggest Kegel exercises to:  Improve bladder and bowel control.  Improve sexual response.  Improve weak pelvic floor muscles after surgery to remove the uterus (hysterectomy) or pregnancy (females).  Improve weak pelvic floor muscles after prostate gland removal or surgery (males). Kegel exercises involve squeezing your pelvic floor muscles, which are the same muscles you squeeze when you try to stop the flow of urine or keep from passing gas. The exercises can be done while sitting, standing, or lying down, but it is best to vary your position. Exercises How to do Kegel exercises: 1. Squeeze your pelvic floor muscles tight. You should feel a tight lift in your rectal area. If you are a female, you should also feel a tightness in your vaginal area. Keep your stomach, buttocks, and legs relaxed. 2. Hold the muscles tight for up to 10 seconds. 3. Breathe normally. 4. Relax your muscles. 5. Repeat as told by your health care provider. Repeat this  exercise daily as told by your health care provider. Continue to do this exercise for at least 4-6 weeks, or for as long as told by your health care provider. You may be referred to a physical therapist who can help you learn more about how to do Kegel exercises. Depending on your condition, your health care provider may recommend:  Varying how long you squeeze your muscles.  Doing several sets of exercises every day.  Doing exercises for several weeks.  Making Kegel exercises a part of your regular exercise routine. This information is not intended to replace advice given to you by your health care provider. Make sure you discuss any questions you have with your health care provider. Document Revised: 09/08/2017 Document Reviewed: 09/08/2017 Elsevier Patient Education  [employer].  Please call Dr. Leland Her nurse Claiborne Billings, RN)  in 2 weeks at [phone]  to let her now how you are doing.   Thank you,  Dr. Jackquline Denmark

## 2020-01-04 NOTE — Progress Notes (Signed)
Chief Complaint: For GI evaluation  Referring Provider:  Mateo Flow, MD      ASSESSMENT AND PLAN;   #1. LLQ pain (resolved). Neg CT AP 08/2019  #2. GERD with eso dysphagia d/t Schatzki's ring s/p dilatation 09/2019 with resolution  #3. Alternating diarrhea/constipation with episodic Fecal incontinence.  Good rectal tone. IBS, post chole. Neg EGD with neg SB Bx/colon with random bx for microscopic colitis 09/2019/CT AP 08/2019   Plan: - Protonix 40 mg p.o. once a day to continue for now. - Cholestryamine 4g 1/2 po QD to begin with. - Kegel's exercises. - FU therafter - If still with problems with incontinence, anorectal manometry with biofeedback if needed - Continue benefiber for now - Minimize nonsteroidals including Mobic. - Call in 2 weeks - D/W pt and patient's daughter.   HPI:    Samantha Schwartz is a 74 y.o. female  Accompanied by her daughter Dysphagia has resolved.  Still has incontinence 1/week.  Associated with abdominal bloating.  Intermittent diarrhea.  Rare constipation.  The abdominal pain has resolved.  Had extensive GI work-up as below.  No weight loss or loss of appetite.  No history suggestive of any food intolerance.  No increased problems with milk or cheese.  No sodas, chocolates, chewing gums, artificial sweeteners and candy.  She does take Mobic for back pain.  Per daughter-her TSH was normal few months ago.  Past GI work-up:  CT Abdo/pelvis with p.o. and IV contrast 08/2019 1. No CT findings of the abdomen or pelvis to explain left lower quadrant abdominal pain. 2. Sigmoid diverticulosis without evidence of acute diverticulitis. 3. Small hiatal hernia. 4. Status post cholecystectomy and appendectomy. 5. Aortic Atherosclerosis (ICD10-I70.0).  EGD 09/26/2019 - Moderate Schatzki ring. Dilated. - 2 cm hiatal hernia. - Gastritis. Biopsied. - Neg gastric Bx for HP, neg SB Bx for celiac.  Colonoscopy 09/26/2019 -Small colonic polyps s/p  polypectomy. Bx- TA.  No need to repeat unless problems. -Moderate sigmoid diverticulosis. -Non-bleeding internal hemorrhoids. -The examination was otherwise normal on direct and retroflexion views. -Neg random colonic bx for microscopic colitis. Past Medical History:  Diagnosis Date  . Arthritis   . GERD (gastroesophageal reflux disease)   . Headache   . Hypertension   . Obesity   . Primary localized osteoarthritis of right knee 11/13/2014  . Shortness of breath dyspnea     Past Surgical History:  Procedure Laterality Date  .  arm surgery Right   . APPENDECTOMY    . CHOLECYSTECTOMY    . HERNIA REPAIR    . KNEE ARTHROSCOPY Right   . TOTAL KNEE ARTHROPLASTY Right 11/13/2014   Procedure: TOTAL KNEE ARTHROPLASTY;  Surgeon: Marchia Bond, MD;  Location: [facility];  Service: Orthopedics;  Laterality: Right;  . TUBAL LIGATION      Family History  Problem Relation Age of Onset  . Diabetes Mellitus II Mother   . Heart disease Mother   . Heart disease Father   . Colon cancer Neg Hx   . Esophageal cancer Neg Hx     Social History   Tobacco Use  . Smoking status: Never Smoker  . Smokeless tobacco: Never Used  Vaping Use  . Vaping Use: Never used  Substance Use Topics  . Alcohol use: No  . Drug use: No    Current Outpatient Medications  Medication Sig Dispense Refill  . aspirin EC 81 MG tablet Take 81 mg by mouth daily. Swallow whole.    . Calcium Carbonate-Vitamin  D (CALCIUM 600+D PO) Take 1 tablet by mouth 2 (two) times daily. Calcium 600mg  w/Vit D 20mg     . Cranberry-Vitamin C-Vitamin E (CRANBERRY PLUS VITAMIN C) 4200-20-3 MG-MG-UNIT CAPS Take 1 tablet by mouth 2 (two) times daily.    . DULoxetine (CYMBALTA) 60 MG capsule 1 capsule daily.    . meloxicam (MOBIC) 15 MG tablet Take 15 mg by mouth daily.    . Multiple Vitamins-Minerals (MULTIVITAMIN WITH MINERALS) tablet Take 1 tablet by mouth daily.    Marland Kitchen omega-3 fish oil (MAXEPA) 1000 MG CAPS capsule Take 1 capsule by mouth  daily.     . pantoprazole (PROTONIX) 40 MG tablet Take 1 tablet (40 mg total) by mouth daily. 30 tablet 11  . Potassium 99 MG TABS Take 1 tablet by mouth 2 (two) times daily.     Marland Kitchen pyridoxine (B-6) 100 MG tablet Take 100 mg by mouth daily.    . Turmeric (QC TUMERIC COMPLEX PO) Take 50 mg by mouth daily.    . valsartan-hydrochlorothiazide (DIOVAN-HCT) 160-12.5 MG tablet Take 1 tablet by mouth daily.    . vitamin B-12 (CYANOCOBALAMIN) 500 MCG tablet Take 500 mcg by mouth daily.     No current facility-administered medications for this visit.    Allergies  Allergen Reactions  . Celebrex [Celecoxib] Rash  . Codeine     Itching. Doesn't make patient feel good  . Sulfur Rash    Review of Systems:  neg     Physical Exam:    BP 122/74   Pulse 80   Ht 5\' 5"  (1.651 m)   Wt 188 lb 2 oz (85.3 kg)   BMI 31.31 kg/m  Wt Readings from Last 3 Encounters:  01/04/20 188 lb 2 oz (85.3 kg)  09/26/19 191 lb (86.6 kg)  08/23/19 191 lb (86.6 kg)   Constitutional:  Well-developed, in no acute distress. Psychiatric: Normal mood and affect. Behavior is normal. HEENT: Pupils normal.  Conjunctivae are normal. No scleral icterus. Neck supple.  Cardiovascular: Normal rate, regular rhythm. No edema Pulmonary/chest: Effort normal and breath sounds normal. No wheezing, rales or rhonchi. Abdominal: Soft, nondistended.  Tenderness in left lower quadrant.  No rebound.. Bowel sounds active throughout. There are no masses palpable. No hepatomegaly. Rectal: Not examined today.  Previously examined in presence of [place].  Small external hemorrhoids.  Good rectal tone.  Brown heme-negative stools. Neurological: Alert and oriented to person place and time. Skin: Skin is warm and dry. No rashes noted.  Data Reviewed: I have personally reviewed following labs and imaging studies  CBC: CBC Latest Ref Rng & Units 09/22/2019 11/15/2014 11/14/2014  WBC 4.0 - 10.5 K/uL 7.3 11.4(H) 9.2  Hemoglobin 12.0 - 15.0 g/dL  12.9 9.9(L) 10.1(L)  Hematocrit 36 - 46 % 38.6 30.5(L) 30.4(L)  Platelets 150 - 400 K/uL 391.0 299 300    CMP: CMP Latest Ref Rng & Units 08/23/2019 11/14/2014 11/05/2014  Glucose 65 - 99 mg/dL 100(H) 144(H) 107(H)  BUN 8 - 27 mg/dL 14 14 12   Creatinine 0.57 - 1.00 mg/dL 0.83 0.69 0.88  Sodium 134 - 144 mmol/L 136 132(L) 135  Potassium 3.5 - 5.2 mmol/L 4.2 3.9 4.0  Chloride 96 - 106 mmol/L 98 95(L) 101  CO2 20 - 29 mmol/L 21 26 25   Calcium 8.7 - 10.3 mg/dL 9.6 8.9 9.5  Total Protein 6.0 - 8.5 g/dL 6.8 - -  Total Bilirubin 0.0 - 1.2 mg/dL <0.2 - -  Alkaline Phos 48 - 121 IU/L 106 - -  AST 0 - 40 IU/L 18 - -  ALT 0 - 32 IU/L 11 - -      Carmell Austria, MD 01/04/2020, 11:21 AM  Cc: Mateo Flow, MD

## 2020-01-18 DIAGNOSIS — H5203 Hypermetropia, bilateral: Secondary | ICD-10-CM | POA: Diagnosis not present

## 2020-01-18 DIAGNOSIS — H25013 Cortical age-related cataract, bilateral: Secondary | ICD-10-CM | POA: Diagnosis not present

## 2020-01-18 DIAGNOSIS — H348322 Tributary (branch) retinal vein occlusion, left eye, stable: Secondary | ICD-10-CM | POA: Diagnosis not present

## 2020-01-18 DIAGNOSIS — H40013 Open angle with borderline findings, low risk, bilateral: Secondary | ICD-10-CM | POA: Diagnosis not present

## 2020-04-18 DIAGNOSIS — H348322 Tributary (branch) retinal vein occlusion, left eye, stable: Secondary | ICD-10-CM | POA: Diagnosis not present

## 2020-04-18 DIAGNOSIS — H25013 Cortical age-related cataract, bilateral: Secondary | ICD-10-CM | POA: Diagnosis not present

## 2020-04-18 DIAGNOSIS — H40013 Open angle with borderline findings, low risk, bilateral: Secondary | ICD-10-CM | POA: Diagnosis not present

## 2020-05-12 DIAGNOSIS — R55 Syncope and collapse: Secondary | ICD-10-CM | POA: Diagnosis not present

## 2020-05-12 DIAGNOSIS — R1084 Generalized abdominal pain: Secondary | ICD-10-CM | POA: Diagnosis not present

## 2020-05-31 DIAGNOSIS — Z1231 Encounter for screening mammogram for malignant neoplasm of breast: Secondary | ICD-10-CM | POA: Diagnosis not present

## 2020-09-27 DIAGNOSIS — I10 Essential (primary) hypertension: Secondary | ICD-10-CM | POA: Diagnosis not present

## 2020-09-27 DIAGNOSIS — F419 Anxiety disorder, unspecified: Secondary | ICD-10-CM | POA: Diagnosis not present

## 2020-09-27 DIAGNOSIS — I700 Atherosclerosis of aorta: Secondary | ICD-10-CM | POA: Diagnosis not present

## 2020-09-27 DIAGNOSIS — K582 Mixed irritable bowel syndrome: Secondary | ICD-10-CM | POA: Diagnosis not present

## 2020-09-27 DIAGNOSIS — Z1331 Encounter for screening for depression: Secondary | ICD-10-CM | POA: Diagnosis not present

## 2020-09-27 DIAGNOSIS — Z6833 Body mass index (BMI) 33.0-33.9, adult: Secondary | ICD-10-CM | POA: Diagnosis not present

## 2020-09-27 DIAGNOSIS — K219 Gastro-esophageal reflux disease without esophagitis: Secondary | ICD-10-CM | POA: Diagnosis not present

## 2020-09-27 DIAGNOSIS — Z0000 Encounter for general adult medical examination without abnormal findings: Secondary | ICD-10-CM | POA: Diagnosis not present

## 2020-09-27 DIAGNOSIS — M2550 Pain in unspecified joint: Secondary | ICD-10-CM | POA: Diagnosis not present

## 2020-10-04 DIAGNOSIS — Z79899 Other long term (current) drug therapy: Secondary | ICD-10-CM | POA: Diagnosis not present

## 2020-10-04 DIAGNOSIS — E7800 Pure hypercholesterolemia, unspecified: Secondary | ICD-10-CM | POA: Diagnosis not present

## 2020-12-03 DIAGNOSIS — Z23 Encounter for immunization: Secondary | ICD-10-CM | POA: Diagnosis not present

## 2021-10-02 DIAGNOSIS — Z1331 Encounter for screening for depression: Secondary | ICD-10-CM | POA: Diagnosis not present

## 2021-10-02 DIAGNOSIS — E7800 Pure hypercholesterolemia, unspecified: Secondary | ICD-10-CM | POA: Diagnosis not present

## 2021-10-02 DIAGNOSIS — F419 Anxiety disorder, unspecified: Secondary | ICD-10-CM | POA: Diagnosis not present

## 2021-10-02 DIAGNOSIS — I10 Essential (primary) hypertension: Secondary | ICD-10-CM | POA: Diagnosis not present

## 2021-10-02 DIAGNOSIS — Z0000 Encounter for general adult medical examination without abnormal findings: Secondary | ICD-10-CM | POA: Diagnosis not present

## 2021-10-02 DIAGNOSIS — I700 Atherosclerosis of aorta: Secondary | ICD-10-CM | POA: Diagnosis not present

## 2021-10-02 DIAGNOSIS — Z6830 Body mass index (BMI) 30.0-30.9, adult: Secondary | ICD-10-CM | POA: Diagnosis not present

## 2021-10-02 DIAGNOSIS — M2550 Pain in unspecified joint: Secondary | ICD-10-CM | POA: Diagnosis not present

## 2021-10-02 DIAGNOSIS — F32A Depression, unspecified: Secondary | ICD-10-CM | POA: Diagnosis not present

## 2021-10-15 DIAGNOSIS — E7800 Pure hypercholesterolemia, unspecified: Secondary | ICD-10-CM | POA: Diagnosis not present

## 2021-10-15 DIAGNOSIS — I10 Essential (primary) hypertension: Secondary | ICD-10-CM | POA: Diagnosis not present

## 2022-03-06 DIAGNOSIS — H6121 Impacted cerumen, right ear: Secondary | ICD-10-CM | POA: Diagnosis not present

## 2022-03-06 DIAGNOSIS — J324 Chronic pansinusitis: Secondary | ICD-10-CM | POA: Diagnosis not present

## 2022-11-03 DIAGNOSIS — F419 Anxiety disorder, unspecified: Secondary | ICD-10-CM | POA: Diagnosis not present

## 2022-11-03 DIAGNOSIS — R413 Other amnesia: Secondary | ICD-10-CM | POA: Diagnosis not present

## 2022-11-03 DIAGNOSIS — Z23 Encounter for immunization: Secondary | ICD-10-CM | POA: Diagnosis not present

## 2022-11-03 DIAGNOSIS — Z0000 Encounter for general adult medical examination without abnormal findings: Secondary | ICD-10-CM | POA: Diagnosis not present

## 2022-11-03 DIAGNOSIS — F32A Depression, unspecified: Secondary | ICD-10-CM | POA: Diagnosis not present

## 2022-11-03 DIAGNOSIS — K219 Gastro-esophageal reflux disease without esophagitis: Secondary | ICD-10-CM | POA: Diagnosis not present

## 2022-11-03 DIAGNOSIS — E7800 Pure hypercholesterolemia, unspecified: Secondary | ICD-10-CM | POA: Diagnosis not present

## 2022-11-03 DIAGNOSIS — I10 Essential (primary) hypertension: Secondary | ICD-10-CM | POA: Diagnosis not present

## 2022-11-03 DIAGNOSIS — Z9181 History of falling: Secondary | ICD-10-CM | POA: Diagnosis not present

## 2022-11-04 DIAGNOSIS — H5203 Hypermetropia, bilateral: Secondary | ICD-10-CM | POA: Diagnosis not present

## 2022-11-05 DIAGNOSIS — Z79899 Other long term (current) drug therapy: Secondary | ICD-10-CM | POA: Diagnosis not present

## 2022-11-05 DIAGNOSIS — R413 Other amnesia: Secondary | ICD-10-CM | POA: Diagnosis not present

## 2022-11-05 DIAGNOSIS — E7800 Pure hypercholesterolemia, unspecified: Secondary | ICD-10-CM | POA: Diagnosis not present

## 2022-12-09 DIAGNOSIS — H2513 Age-related nuclear cataract, bilateral: Secondary | ICD-10-CM | POA: Diagnosis not present

## 2022-12-09 DIAGNOSIS — H348322 Tributary (branch) retinal vein occlusion, left eye, stable: Secondary | ICD-10-CM | POA: Diagnosis not present

## 2023-02-11 DIAGNOSIS — H40013 Open angle with borderline findings, low risk, bilateral: Secondary | ICD-10-CM | POA: Diagnosis not present

## 2023-02-11 DIAGNOSIS — H18413 Arcus senilis, bilateral: Secondary | ICD-10-CM | POA: Diagnosis not present

## 2023-02-11 DIAGNOSIS — H25013 Cortical age-related cataract, bilateral: Secondary | ICD-10-CM | POA: Diagnosis not present

## 2023-02-11 DIAGNOSIS — H348322 Tributary (branch) retinal vein occlusion, left eye, stable: Secondary | ICD-10-CM | POA: Diagnosis not present

## 2023-02-11 DIAGNOSIS — H2512 Age-related nuclear cataract, left eye: Secondary | ICD-10-CM | POA: Diagnosis not present

## 2023-02-11 DIAGNOSIS — H25043 Posterior subcapsular polar age-related cataract, bilateral: Secondary | ICD-10-CM | POA: Diagnosis not present

## 2023-02-11 DIAGNOSIS — H2513 Age-related nuclear cataract, bilateral: Secondary | ICD-10-CM | POA: Diagnosis not present

## 2023-04-14 DIAGNOSIS — H2512 Age-related nuclear cataract, left eye: Secondary | ICD-10-CM | POA: Diagnosis not present

## 2023-04-14 DIAGNOSIS — H2513 Age-related nuclear cataract, bilateral: Secondary | ICD-10-CM | POA: Diagnosis not present

## 2023-04-15 DIAGNOSIS — H25011 Cortical age-related cataract, right eye: Secondary | ICD-10-CM | POA: Diagnosis not present

## 2023-04-15 DIAGNOSIS — H2511 Age-related nuclear cataract, right eye: Secondary | ICD-10-CM | POA: Diagnosis not present

## 2023-04-15 DIAGNOSIS — H25041 Posterior subcapsular polar age-related cataract, right eye: Secondary | ICD-10-CM | POA: Diagnosis not present

## 2023-04-28 DIAGNOSIS — H2511 Age-related nuclear cataract, right eye: Secondary | ICD-10-CM | POA: Diagnosis not present
